# Patient Record
Sex: Female | Born: 1970
Health system: Southern US, Community
[De-identification: ages and names within clinical notes are randomized; demographics above are authoritative.]

## PROBLEM LIST (undated history)

## (undated) DIAGNOSIS — R51 Headache: Secondary | ICD-10-CM

## (undated) DIAGNOSIS — D219 Benign neoplasm of connective and other soft tissue, unspecified: Secondary | ICD-10-CM

## (undated) DIAGNOSIS — N939 Abnormal uterine and vaginal bleeding, unspecified: Secondary | ICD-10-CM

## (undated) DIAGNOSIS — D649 Anemia, unspecified: Secondary | ICD-10-CM

## (undated) DIAGNOSIS — H40053 Ocular hypertension, bilateral: Secondary | ICD-10-CM

## (undated) DIAGNOSIS — K649 Unspecified hemorrhoids: Secondary | ICD-10-CM

## (undated) DIAGNOSIS — K5900 Constipation, unspecified: Secondary | ICD-10-CM

## (undated) DIAGNOSIS — R519 Headache, unspecified: Secondary | ICD-10-CM

## (undated) DIAGNOSIS — K219 Gastro-esophageal reflux disease without esophagitis: Secondary | ICD-10-CM

## (undated) DIAGNOSIS — N926 Irregular menstruation, unspecified: Secondary | ICD-10-CM

## (undated) DIAGNOSIS — K589 Irritable bowel syndrome without diarrhea: Secondary | ICD-10-CM

## (undated) HISTORY — PX: COLONOSCOPY: SHX174

## (undated) HISTORY — PX: VASECTOMY: SHX75

## (undated) HISTORY — DX: Constipation, unspecified: K59.00

## (undated) HISTORY — DX: Ocular hypertension, bilateral: H40.053

## (undated) HISTORY — DX: Benign neoplasm of connective and other soft tissue, unspecified: D21.9

## (undated) HISTORY — DX: Unspecified hemorrhoids: K64.9

## (undated) HISTORY — PX: NM RENAL LASIX (ARMC HX): HXRAD1213

## (undated) HISTORY — PX: INDUCED ABORTION: SHX677

## (undated) HISTORY — PX: WISDOM TOOTH EXTRACTION: SHX21

## (undated) HISTORY — DX: Irritable bowel syndrome, unspecified: K58.9

## (undated) HISTORY — DX: Anemia, unspecified: D64.9

## (undated) HISTORY — PX: UPPER GASTROINTESTINAL ENDOSCOPY: SHX188

---

## 1999-07-08 ENCOUNTER — Other Ambulatory Visit: Admission: RE | Admit: 1999-07-08 | Discharge: 1999-07-08 | Payer: Self-pay | Admitting: Obstetrics and Gynecology

## 2000-08-17 ENCOUNTER — Other Ambulatory Visit: Admission: RE | Admit: 2000-08-17 | Discharge: 2000-08-17 | Payer: Self-pay | Admitting: Obstetrics and Gynecology

## 2001-08-29 ENCOUNTER — Other Ambulatory Visit: Admission: RE | Admit: 2001-08-29 | Discharge: 2001-08-29 | Payer: Self-pay | Admitting: Obstetrics and Gynecology

## 2002-10-29 ENCOUNTER — Other Ambulatory Visit: Admission: RE | Admit: 2002-10-29 | Discharge: 2002-10-29 | Payer: Self-pay | Admitting: Obstetrics and Gynecology

## 2003-08-14 ENCOUNTER — Other Ambulatory Visit: Admission: RE | Admit: 2003-08-14 | Discharge: 2003-08-14 | Payer: Self-pay | Admitting: Obstetrics and Gynecology

## 2004-01-15 ENCOUNTER — Ambulatory Visit (HOSPITAL_COMMUNITY): Admission: RE | Admit: 2004-01-15 | Discharge: 2004-01-15 | Payer: Self-pay | Admitting: *Deleted

## 2004-09-14 ENCOUNTER — Other Ambulatory Visit: Admission: RE | Admit: 2004-09-14 | Discharge: 2004-09-14 | Payer: Self-pay | Admitting: Obstetrics and Gynecology

## 2005-09-23 ENCOUNTER — Other Ambulatory Visit: Admission: RE | Admit: 2005-09-23 | Discharge: 2005-09-23 | Payer: Self-pay | Admitting: Obstetrics and Gynecology

## 2006-12-26 ENCOUNTER — Encounter: Admission: RE | Admit: 2006-12-26 | Discharge: 2006-12-26 | Payer: Self-pay | Admitting: Obstetrics and Gynecology

## 2006-12-29 ENCOUNTER — Encounter: Admission: RE | Admit: 2006-12-29 | Discharge: 2006-12-29 | Payer: Self-pay | Admitting: Obstetrics and Gynecology

## 2007-08-15 ENCOUNTER — Ambulatory Visit: Payer: Self-pay | Admitting: Internal Medicine

## 2007-08-20 ENCOUNTER — Ambulatory Visit (HOSPITAL_COMMUNITY): Admission: RE | Admit: 2007-08-20 | Discharge: 2007-08-20 | Payer: Self-pay | Admitting: Internal Medicine

## 2007-08-30 DIAGNOSIS — G43909 Migraine, unspecified, not intractable, without status migrainosus: Secondary | ICD-10-CM | POA: Insufficient documentation

## 2007-08-30 DIAGNOSIS — K5901 Slow transit constipation: Secondary | ICD-10-CM | POA: Insufficient documentation

## 2007-12-13 ENCOUNTER — Telehealth: Payer: Self-pay | Admitting: Internal Medicine

## 2008-03-04 ENCOUNTER — Telehealth: Payer: Self-pay | Admitting: Internal Medicine

## 2008-06-02 IMAGING — MG MM DIAGNOSTIC LTD LEFT
3 series · 3 of 3 positions shown · non-contrast
Comparison: Baseline screening mammogram dated 12/26/06.

[REDACTED] LEFT
CC and MLO view(s) were taken of the left breast.

DIGITAL LIMITED LEFT DIAGNOSTIC MAMMOGRAM WITH CAD:
CLINICAL DATA: Possible left breast mass at recent screening mammography.

[L MLO (1 of 2)]
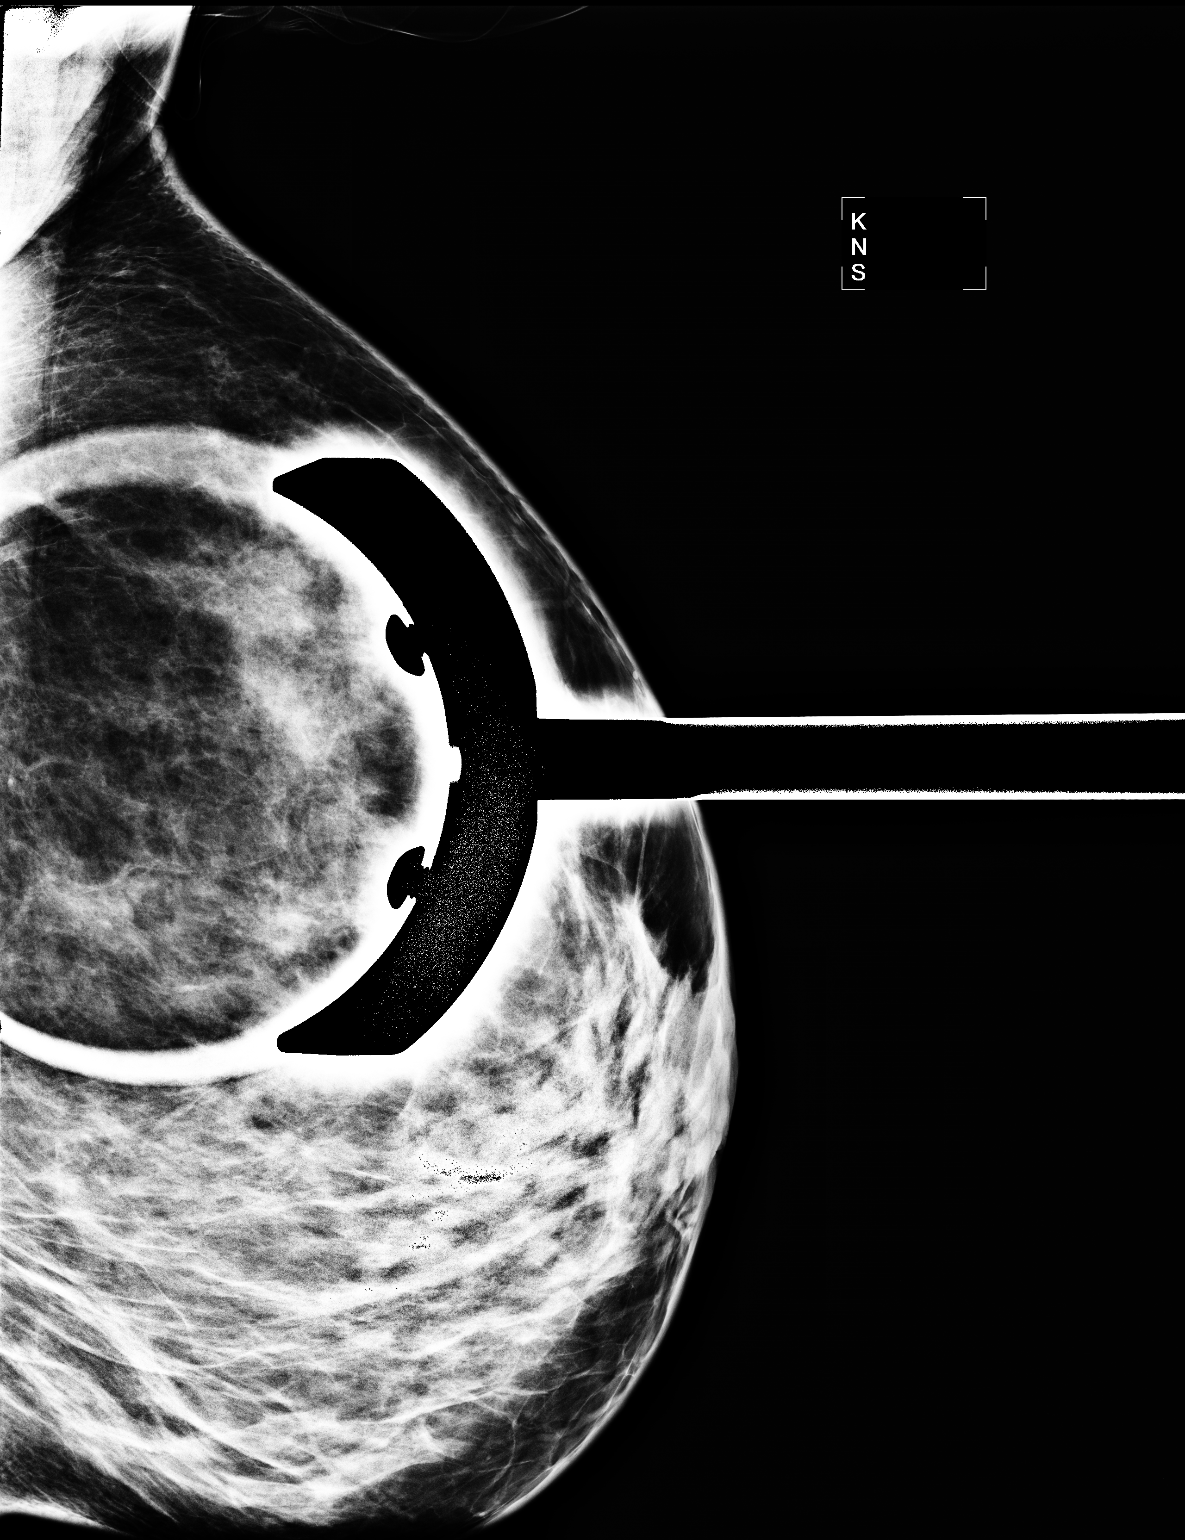

[L MLO (2 of 2)]
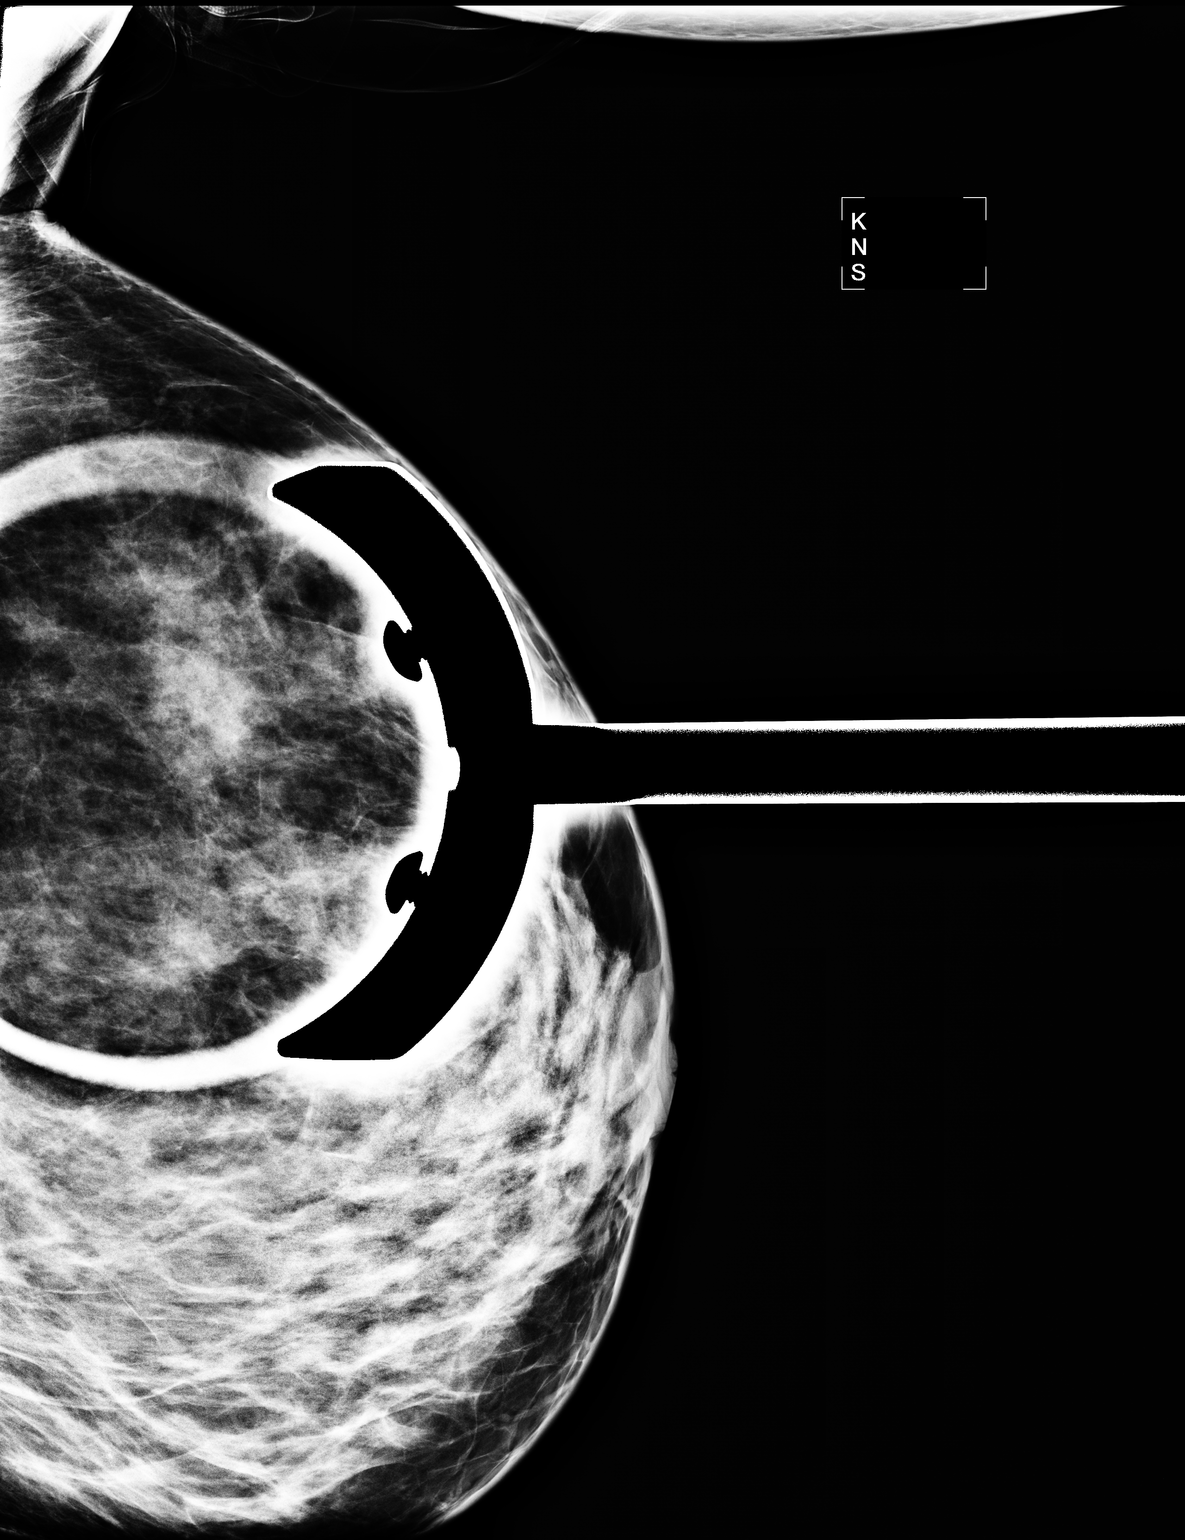

[L ML]
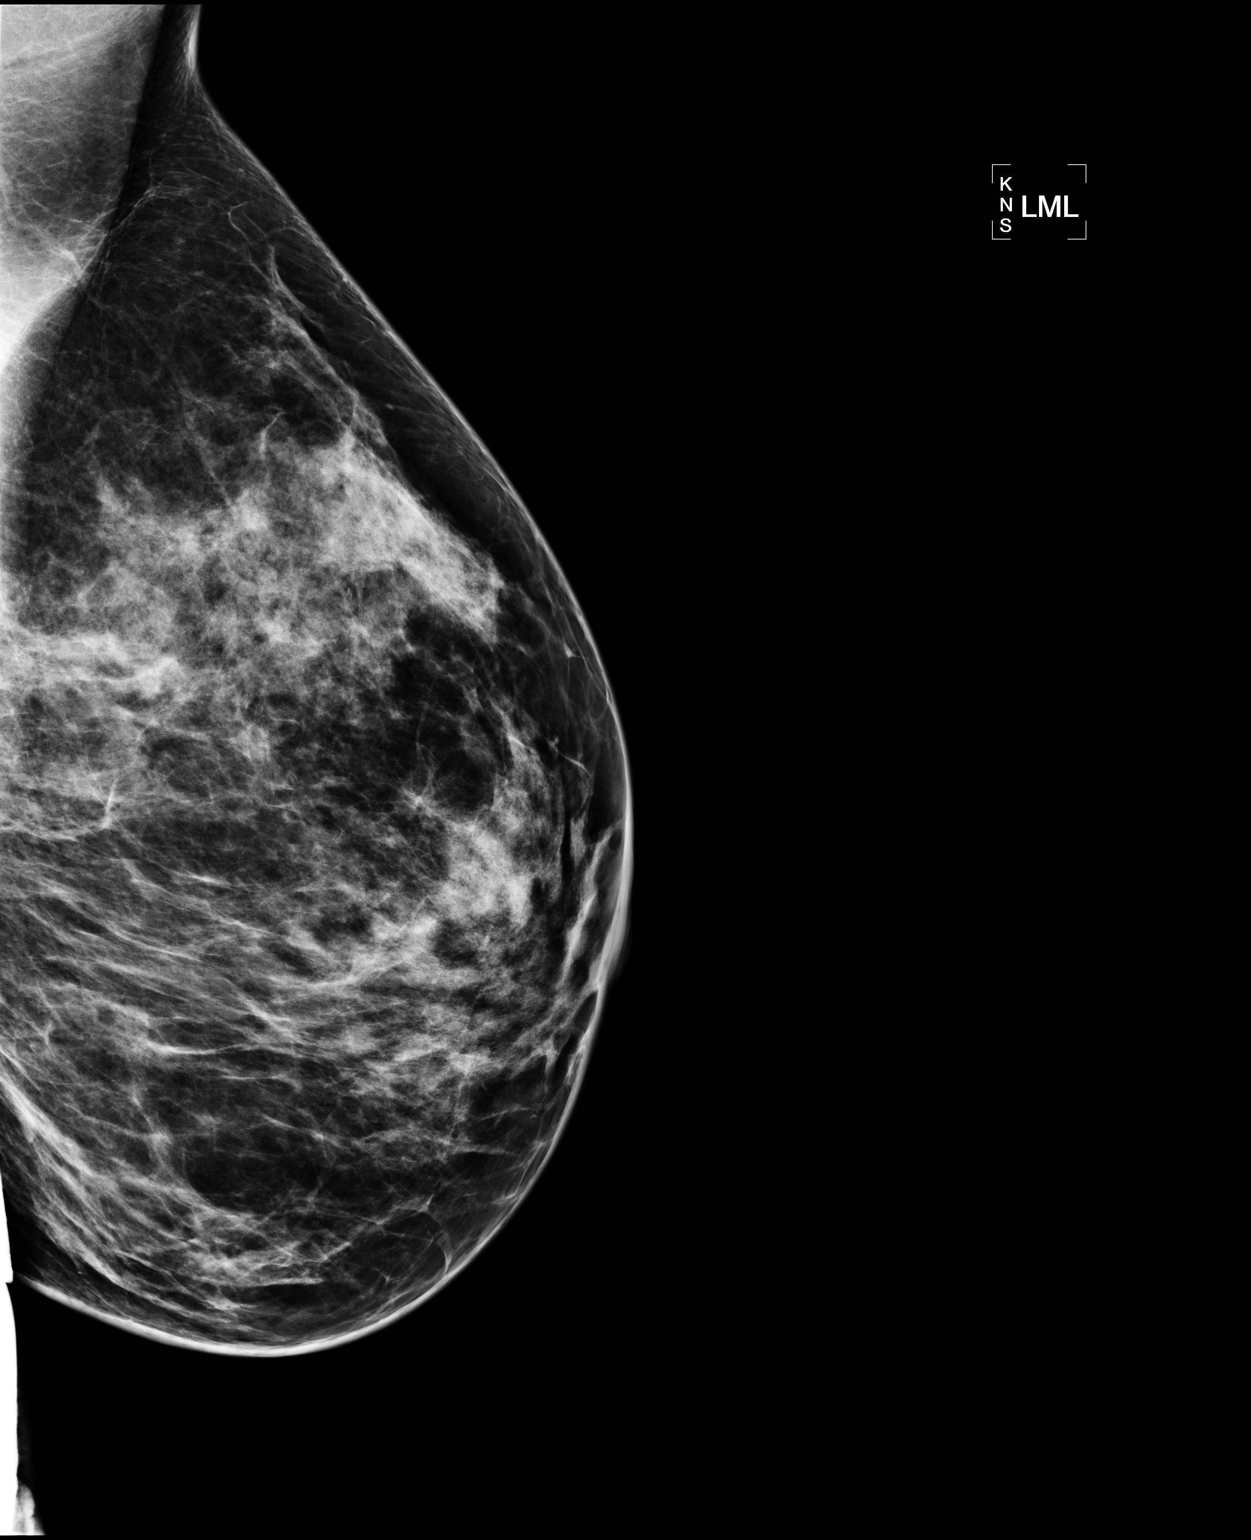

[3 of 3 positions shown; findings below may reference images not displayed]

True lateral and spot compression oblique views of the left breast demonstrate normal-appearing 
breast parenchyma at the locations of the recently suspected masses. No true mass or other findings
suspicious for malignancy are seen.
IMPRESSION: No evidence of malignancy.  The recently suspected left breast masses represented overlapping of 
normal tissue.  Screening mammography is recommended at age 38.

ASSESSMENT: Negative - BI-RADS 1

Screening mammogram of both breasts at age 38.
ANALYZED BY COMPUTER AIDED DETECTION. , THIS PROCEDURE WAS A DIGITAL MAMMOGRAM.

## 2010-06-20 ENCOUNTER — Encounter: Payer: Self-pay | Admitting: Obstetrics and Gynecology

## 2010-10-12 NOTE — Assessment & Plan Note (Signed)
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  She saw  Dr. Loreta [name] in 1995 and had a complete evaluation, which included upper  endoscopy and colonoscopy.  We do not have the records, but the patient  was told the exams were normal.  She subsequently saw Dr. Madilyn [name] for her  constipation and was put on MiraLax, which worked for a while, but she  really did not like taking it.  Her constipation is moderately severe.  Without a laxative, patient has bowel movement every four to five days,  but it is usually associated with abdominal distention, a feeling of  fullness, and just not feeling well.  She has found a senna-containing  tea.  It is called Super Dieters Tea, and she takes one bag several  times a week with excellent results.  She has been doing this now for  about two years with satisfactory results.  She has not missed any work  and has not had any increase in the abdominal pain.  There is a family  history of colon cancer in an indirect relative.  This was a grandfather  and some uncle.  There is nobody with Crohn's disease or ulcerative  colitis.  She is very active at work, working as a Barrister's clerk.  She has good eating habits, emphasizing fiber.  Her weight  has been about ten pounds above her usual weight.  She has switched  shifts from daytime job to 7 p.m. to 7 a.m. 12-hour night jobs in the  last year, but her constipation preceded the change in the shift.   MEDICATIONS:  Birth control pills.   PAST HISTORY:  Essentially negative for any operations.   FAMILY HISTORY:  Negative except for colon cancer in an uncle and a  grandfather.   SOCIAL HISTORY:  Single, respiratory therapist.  She has an associate  degree.  She does not smoke and does not drink alcohol.   REVIEW OF SYSTEMS:  Positive for allergies, sleeping problems due to  night schedule.   PHYSICAL EXAM:  Blood pressure 106/66, pulse 72 and weight 150.4 pounds,  which is about 10 pounds above her usual weight.  She was healthy-appearing, alert, oriented, very cooperative.  Sclerae was anicteric.  Oral cavity was normal.  NECK:  Supple, no adenopathy.  Thyroid not enlarged.  LUNGS:  Clear to auscultation.  COR:  With normal S1, normal S2.  ABDOMEN:  Soft, not protuberant.  Bowel sounds were somewhat hypoactive.  There was no tenderness, no palpable mass, no CVA tenderness.  RECTAL EXAM:  With normal tone.  There was essentially no stool.  Mucus  was Hemoccult-negative.   IMPRESSION:  Patient is a 40 year old African-American female with  chronic constipation, most likely due to decreased motility.  She had a  normal colonoscopic exam within  the last 13 years.  I do not see any  reason to repeat it, but I would appreciate her getting a Sitzmark  transit time study, which will enable to tell us whether she had diffuse  hypomotility of the colon or just focal hypomotility in the rectum.   PLAN:  I think her senna tea is quite adequate.  It is a mild natural  stimulant.  She does not have any excessive pain with it and prefers it  from MiraLax.  She has been quite compliant with it and has had good  results.  Depending on the results of Sitzmark study, we may possibly  add some other medication, such as Amitiza.  I would like to see her  again in about three months to discuss the effect of her medications.     Sydney [name]. Sydney Chance, MD  Electronically Signed    DMB/MedQ  DD: 08/15/2007  DT: 08/15/2007  Job #: [id number]   cc:   Sydney [name]. Sydney [name], M.D.

## 2011-01-13 ENCOUNTER — Other Ambulatory Visit: Payer: Self-pay | Admitting: Obstetrics and Gynecology

## 2011-01-13 DIAGNOSIS — Z1231 Encounter for screening mammogram for malignant neoplasm of breast: Secondary | ICD-10-CM

## 2011-01-20 ENCOUNTER — Ambulatory Visit: Payer: Self-pay

## 2011-05-31 HISTORY — PX: MYOMECTOMY: SHX85

## 2011-09-19 ENCOUNTER — Encounter (HOSPITAL_COMMUNITY): Payer: Self-pay | Admitting: *Deleted

## 2011-09-30 ENCOUNTER — Encounter (HOSPITAL_COMMUNITY): Payer: Self-pay

## 2011-10-10 NOTE — H&P (Addendum)
Sydney [name] presents today for second opinion evaluation of fibroids.  Apparently never had any problems and has never been told she has had problems and has been on birth control pills and began having very heavy periods and cramping with her periods.  Dr. Senaida Ores had recommended changing the birth control pills.  Verena insisted on doing an ultrasound.  She had an ultrasound including saline infusion ultrasound dated 07/26/11.  I have that with me today.  I reviewed this.  It shows multiple fibroids, they stopped at 9.  As far as measuring, the largest is 20 mm.  On saline infusion ultrasound, two of these, the 1.3 cm and the 0.8 cm one were submucosal.  The patient subsequently changed birth control pills, had more bleeding, went back to her regular birth control pill, which is Lo-Ogestrel which is a 30 mcg pill, and actually she has done very well on this does.  Since then, the bleeding has actually improved.  She and her boyfriend have questions about fertility, different options with the fibroids.  She has lots of questions with regard to fibroids. A&P: Fibroids, menorrhagia, dysmenorrhea.  Review of the ultrasound, reviewed with fibroids are, discussed different options.  The first question is whether or not she desires fertility.  They are unsure on this whether they want children, they just do not want to do anything permanent at this time, so they are unclear on that.  Discussed age-related fertility decline.  Discussed advanced maternal age and risks associated with Down's and chromosomal disorders in the 96s but otherwise medically she is stable.  Discussed infertility related to fibroids.  We would not know that unless we did an HSG or some kind of evaluation of the endometrial cavity.  With regard to fibroids, we can manage this hormonally, continue with birth control pills trying to find the right pill, and at this time apparently she is doing very well with the one she is on, so we will stay  with that.  Discussed the possible use of Depo-Lupron if she ever gets in trouble but I think that is unlikely.  Discussed that the fibroids will most likely change or get worse with time.  Discussed myomectomy, which may not give quite the benefit since she has multiple fibroids and they are all very small.  Right now her main problem is the bleeding.  Discussed the submucosal fibroids.  Discussed different options with them such as hysteroscopic resection of these.  At this time, she wants to proceed with hysteroscopic resection of the fibroids.  I discussed the procedure at length.  Discussed that we would only be able to shave the part of the fibroid that is in the endometrial cavity, which may not remove the fibroid.  They certainly can recur or worsen and it may only give temporary benefit.  For now, she wants to proceed with that since it is outpatient.  Discussed the risks and benefits of the procedure at length including but not limited to risk of infection, bleeding, damage to the uterus, tubes, ovaries, bowel, bladder, possibility that they can recur if it does not remove the fibroid.  For now she wants to proceed with that, knowing that other options could include myomectomy, hysterectomy or repeat evaluation of the fibroids.  I did recommend that at the very minimum, repeat ultrasound in six months for evaluation of the fibroids, since this is a new diagnosis. DL  This patient has been seen and examined.   All of her questions were answered.  Labs and vital signs reviewed.  Informed consent has been obtained.  The History and Physical is current.  10/14/11 DL

## 2011-10-13 MED ORDER — DEXTROSE 5 % IV SOLN
1.0000 g | INTRAVENOUS | Status: AC
  Administered 2011-10-14: 1 g via INTRAVENOUS
  Filled 2011-10-13: qty 1

## 2011-10-14 ENCOUNTER — Encounter (HOSPITAL_COMMUNITY): Payer: Self-pay | Admitting: Anesthesiology

## 2011-10-14 ENCOUNTER — Encounter (HOSPITAL_COMMUNITY)
Admission: RE | Disposition: A | Discharge status: Home / Self Care | Payer: Self-pay | Source: Ambulatory Visit | Attending: Obstetrics and Gynecology

## 2011-10-14 ENCOUNTER — Ambulatory Visit (HOSPITAL_COMMUNITY): Payer: 59 | Admitting: Anesthesiology

## 2011-10-14 ENCOUNTER — Ambulatory Visit (HOSPITAL_COMMUNITY)
Admission: RE | Admit: 2011-10-14 | Discharge: 2011-10-14 | Disposition: A | Discharge status: Home / Self Care | Payer: 59 | Source: Ambulatory Visit | Attending: Obstetrics and Gynecology | Admitting: Obstetrics and Gynecology

## 2011-10-14 DIAGNOSIS — N920 Excessive and frequent menstruation with regular cycle: Secondary | ICD-10-CM | POA: Insufficient documentation

## 2011-10-14 DIAGNOSIS — D250 Submucous leiomyoma of uterus: Secondary | ICD-10-CM | POA: Insufficient documentation

## 2011-10-14 DIAGNOSIS — N946 Dysmenorrhea, unspecified: Secondary | ICD-10-CM | POA: Insufficient documentation

## 2011-10-14 HISTORY — PX: HYSTEROSCOPY WITH D & C: SHX1775

## 2011-10-14 HISTORY — DX: Irregular menstruation, unspecified: N92.6

## 2011-10-14 HISTORY — DX: Headache: R51

## 2011-10-14 HISTORY — DX: Abnormal uterine and vaginal bleeding, unspecified: N93.9

## 2011-10-14 LAB — CBC
HCT: 37.5 % (ref 36.0–46.0)
MCH: 26.8 pg (ref 26.0–34.0)
MCV: 83.9 fL (ref 78.0–100.0)
RBC: 4.47 MIL/uL (ref 3.87–5.11)
WBC: 7.7 10*3/uL (ref 4.0–10.5)

## 2011-10-14 LAB — PREGNANCY, URINE: Preg Test, Ur: NEGATIVE

## 2011-10-14 SURGERY — DILATATION AND CURETTAGE /HYSTEROSCOPY
Anesthesia: General | Site: Uterus | Wound class: Clean Contaminated

## 2011-10-14 MED ORDER — MIDAZOLAM HCL 2 MG/2ML IJ SOLN: 0.5000 mg | Freq: Once | INTRAMUSCULAR | Status: DC | PRN

## 2011-10-14 MED ORDER — KETOROLAC TROMETHAMINE 30 MG/ML IJ SOLN
INTRAMUSCULAR | Status: DC | PRN
  Administered 2011-10-14: 30 mg via INTRAVENOUS

## 2011-10-14 MED ORDER — FENTANYL CITRATE 0.05 MG/ML IJ SOLN
INTRAMUSCULAR | Status: DC | PRN
  Administered 2011-10-14 (×2): 50 ug via INTRAVENOUS

## 2011-10-14 MED ORDER — ACETAMINOPHEN 325 MG PO TABS: 325.0000 mg | ORAL_TABLET | ORAL | Status: DC | PRN

## 2011-10-14 MED ORDER — SODIUM CHLORIDE 0.9 % IR SOLN
Status: DC | PRN
  Administered 2011-10-14 (×2): 3000 mL

## 2011-10-14 MED ORDER — KETOROLAC TROMETHAMINE 30 MG/ML IJ SOLN
INTRAMUSCULAR | Status: AC
  Filled 2011-10-14: qty 1

## 2011-10-14 MED ORDER — LIDOCAINE HCL (CARDIAC) 20 MG/ML IV SOLN
INTRAVENOUS | Status: AC
  Filled 2011-10-14: qty 5

## 2011-10-14 MED ORDER — LIDOCAINE HCL (CARDIAC) 20 MG/ML IV SOLN
INTRAVENOUS | Status: DC | PRN
  Administered 2011-10-14: 50 mg via INTRAVENOUS

## 2011-10-14 MED ORDER — MIDAZOLAM HCL 2 MG/2ML IJ SOLN
INTRAMUSCULAR | Status: AC
  Filled 2011-10-14: qty 2

## 2011-10-14 MED ORDER — PROPOFOL 10 MG/ML IV EMUL
INTRAVENOUS | Status: AC
  Filled 2011-10-14: qty 20

## 2011-10-14 MED ORDER — KETOROLAC TROMETHAMINE 30 MG/ML IJ SOLN: 15.0000 mg | Freq: Once | INTRAMUSCULAR | Status: DC | PRN

## 2011-10-14 MED ORDER — ONDANSETRON HCL 4 MG/2ML IJ SOLN
INTRAMUSCULAR | Status: AC
  Filled 2011-10-14: qty 2

## 2011-10-14 MED ORDER — PROPOFOL 10 MG/ML IV EMUL
INTRAVENOUS | Status: DC | PRN
  Administered 2011-10-14: 200 mg via INTRAVENOUS

## 2011-10-14 MED ORDER — FENTANYL CITRATE 0.05 MG/ML IJ SOLN
INTRAMUSCULAR | Status: AC
  Filled 2011-10-14: qty 2

## 2011-10-14 MED ORDER — FENTANYL CITRATE 0.05 MG/ML IJ SOLN
25.0000 ug | INTRAMUSCULAR | Status: DC | PRN
  Administered 2011-10-14: 25 ug via INTRAVENOUS
  Administered 2011-10-14: 50 ug via INTRAVENOUS

## 2011-10-14 MED ORDER — MIDAZOLAM HCL 5 MG/5ML IJ SOLN
INTRAMUSCULAR | Status: DC | PRN
  Administered 2011-10-14: 2 mg via INTRAVENOUS

## 2011-10-14 MED ORDER — LACTATED RINGERS IV SOLN
INTRAVENOUS | Status: DC
  Administered 2011-10-14 (×2): via INTRAVENOUS

## 2011-10-14 MED ORDER — MEPERIDINE HCL 25 MG/ML IJ SOLN: 6.2500 mg | INTRAMUSCULAR | Status: DC | PRN

## 2011-10-14 MED ORDER — PROMETHAZINE HCL 25 MG/ML IJ SOLN: 6.2500 mg | INTRAMUSCULAR | Status: DC | PRN

## 2011-10-14 MED ORDER — ONDANSETRON HCL 4 MG/2ML IJ SOLN
INTRAMUSCULAR | Status: DC | PRN
  Administered 2011-10-14: 4 mg via INTRAVENOUS

## 2011-10-14 MED ORDER — DEXTROSE-NACL 5-0.45 % IV SOLN: INTRAVENOUS | Status: DC

## 2011-10-14 MED ORDER — HYDROCODONE-ACETAMINOPHEN 5-500 MG PO TABS: 1.0000 | ORAL_TABLET | Freq: Four times a day (QID) | ORAL | Status: AC | PRN

## 2011-10-14 SURGICAL SUPPLY — 16 items
CANISTER SUCTION 2500CC (MISCELLANEOUS) ×2 IMPLANT
CATH ROBINSON RED A/P 16FR (CATHETERS) ×2 IMPLANT
CLOTH BEACON ORANGE TIMEOUT ST (SAFETY) ×2 IMPLANT
CONTAINER PREFILL 10% NBF 60ML (FORM) ×2 IMPLANT
ELECT REM PT RETURN 9FT ADLT (ELECTROSURGICAL)
ELECTRODE REM PT RTRN 9FT ADLT (ELECTROSURGICAL) IMPLANT
GLOVE BIO SURGEON STRL SZ8 (GLOVE) ×2 IMPLANT
GLOVE SURG ORTHO 8.0 STRL STRW (GLOVE) ×2 IMPLANT
GOWN PREVENTION PLUS LG XLONG (DISPOSABLE) ×2 IMPLANT
GOWN STRL REIN XL XLG (GOWN DISPOSABLE) ×2 IMPLANT
LOOP ANGLED CUTTING 22FR (CUTTING LOOP) IMPLANT
PACK HYSTEROSCOPY LF (CUSTOM PROCEDURE TRAY) ×2 IMPLANT
SOLUTION ANTI FOG 6CC (MISCELLANEOUS) ×2 IMPLANT
TOWEL OR 17X24 6PK STRL BLUE (TOWEL DISPOSABLE) ×4 IMPLANT
TRUCLEAR BLADE 2.9 ×2 IMPLANT
WATER STERILE IRR 1000ML POUR (IV SOLUTION) ×2 IMPLANT

## 2011-10-14 NOTE — Op Note (Signed)
NAME:  DAMARI, HILTZ          ACCOUNT NO.:  [id number]  MEDICAL RECORD NO.:  [id number]  LOCATION:  [facility]                          FACILITY:  [facility]  PHYSICIAN:  Dineen Kid. Rana Snare, M.D.    DATE OF BIRTH:  10/21/1970  DATE OF PROCEDURE: DATE OF DISCHARGE:                              OPERATIVE REPORT   PREOPERATIVE DIAGNOSES:  Abnormal uterine bleeding, fibroids with submucosal and intracavitary component.  POSTOPERATIVE DIAGNOSES:  Abnormal uterine bleeding, fibroids with submucosal and intracavitary component.  PROCEDURE:  Hysteroscopy, dilation and curettage with a TRUCLEAR resection of intracavitary and submucosal fibroids.  SURGEON:  Dineen Kid. Rana Snare, MD  ANESTHESIA:  General via LMA.  INDICATIONS:  Ms. Mayford Knife is a 41 year old with worsening abnormal bleeding and menorrhagia with some control on birth control pills.  She had a saline infusion ultrasound which shows intracavitary and submucosal fibroids.  She desires resection of these fibroids.  We discussed the risks and benefits of the procedure at length which include but not limited to risks of infection, bleeding, damage to uterus, tubes, ovary, bowel, and bladder, possibility that this may not alleviate and the bleeding could recur or worsen, and this is no cure for the abnormal bleeding as they can recur.  She does give informed consent and wished to proceed.  FINDINGS AT TIME OF SURGERY:  A moderate-sized posterior wall intracavitary fibroid, approximately 2-3 cm in size and a submucosal fibroid near the anterior wall approximately 1.5 cm in size.  DESCRIPTION OF PROCEDURE:  After adequate analgesia, the patient was placed in the dorsal lithotomy position.  She was sterilely prepped and draped.  Bladder was sterilely drained.  Graves speculum was placed. Tenaculum was placed into the anterior lip of the cervix.  The cervix was dilated to a #27 Pratt dilator.  The above findings were noted.  A TRUCLEAR device was  inserted into the posterior wall.  A 2.5-cm fibroid was easily identified and TRUCLEAR resection was used to dissect and resect across the entirety of the fibroid and down to flush to the posterior wall with good hemostasis achieved and the entire fibroid was completely removed.  A right fundal anterior fibroid of approximately 1.5 cm was noted with approximately two thirds of its within the cavity. TRUCLEAR device was used to dissect across this.  As we approached the uterine wall, the remaining portion of the fibroid popped into the cavity and it was felt that the entire fibroid was resected until the wall was flushed.  No further fibroids were noted within the endometrial cavity.  Normal-appearing ostia and cervix.  Good hemostasis was achieved.  No complications noted.  The TRUCLEAR was then removed. Tenaculum was removed from the anterior lip of the cervix.  The patient was sent to the recovery room in stable condition.  Sponge and instrument counts were normal x3.  Saline deficit was 740 mL.  Minimal bleeding was noted.  The patient received 1 g of cefotetan preoperatively and 30 mg of Toradol postoperatively.  DISPOSITION:  The patient will be discharged home.  She will follow up in the office in 2-3 weeks.  She is given the routine instruction sheet for D and C, told to return for increased pain, fever,  or bleeding.  I also sent home with a prescription for Vicodin.     Dineen Kid Rana Snare, M.D.     DCL/MEDQ  D:  10/14/2011  T:  10/14/2011  Job:  [id number]

## 2011-10-14 NOTE — Discharge Instructions (Addendum)
Fibroids Fibroids are lumps (tumors) that can occur any place in a woman's body. These lumps are not cancerous. Fibroids vary in size, weight, and where they grow. HOME CARE  Do not take aspirin.   Write down the number of pads or tampons you use during your period. Tell your doctor. This can help determine the best treatment for you.  GET HELP RIGHT AWAY IF:  You have pain in your lower belly (abdomen) that is not helped with medicine.   You have cramps that are not helped with medicine.   You have more bleeding between or during your period.   You feel lightheaded or pass out (faint).   Your lower belly pain gets worse.  MAKE SURE YOU:  Understand these instructions.   Will watch your condition.   Will get help right away if you are not doing well or get worse.  Document Released: 06/18/2010 Document Revised: 05/05/2011 Document Reviewed: 06/18/2010 Exit[employer] Patient Information 2012 [employer]. 

## 2011-10-14 NOTE — Transfer of Care (Signed)
Immediate Anesthesia Transfer of Care Note  Patient: Sydney [name]  Procedure(s) Performed: Procedure(s) (LRB): DILATATION AND CURETTAGE /HYSTEROSCOPY (N/A)  Patient Location: PACU  Anesthesia Type: General  Level of Consciousness: awake, alert  and oriented  Airway & Oxygen Therapy: Patient Spontanous Breathing and Patient connected to nasal cannula oxygen  Post-op Assessment: Report given to PACU RN and Post -op Vital signs reviewed and stable  Post vital signs: Reviewed and stable  Complications: No apparent anesthesia complications

## 2011-10-14 NOTE — Anesthesia Preprocedure Evaluation (Signed)
Anesthesia Evaluation  Patient identified by MRN, date of birth, ID band Patient awake    Reviewed: Allergy & Precautions, H&P , Patient's Chart, lab work & pertinent test results, reviewed documented beta blocker date and time   History of Anesthesia Complications Negative for: history of anesthetic complications  Airway Mallampati: II TM Distance: >3 FB Neck ROM: full    Dental No notable dental hx.    Pulmonary neg pulmonary ROS,  breath sounds clear to auscultation  Pulmonary exam normal       Cardiovascular Exercise Tolerance: Good negative cardio ROS  Rhythm:regular Rate:Normal     Neuro/Psych negative neurological ROS  negative psych ROS   GI/Hepatic negative GI ROS, Neg liver ROS,   Endo/Other  negative endocrine ROS  Renal/GU negative Renal ROS     Musculoskeletal   Abdominal   Peds  Hematology negative hematology ROS (+)   Anesthesia Other Findings   Reproductive/Obstetrics negative OB ROS                           Anesthesia Physical Anesthesia Plan  ASA: I  Anesthesia Plan: General LMA   Post-op Pain Management:    Induction:   Airway Management Planned:   Additional Equipment:   Intra-op Plan:   Post-operative Plan:   Informed Consent: I have reviewed the patients History and Physical, chart, labs and discussed the procedure including the risks, benefits and alternatives for the proposed anesthesia with the patient or authorized representative who has indicated his/her understanding and acceptance.   Dental Advisory Given  Plan Discussed with: CRNA, Surgeon and Anesthesiologist  Anesthesia Plan Comments:         Anesthesia Quick Evaluation  

## 2011-10-14 NOTE — Op Note (Addendum)
AUB, Fibroids Procedure:  Hyst with True clear resections of intracavitary/submocousal fibroids Sydney [name] Anesthesia:  gen by LMA EBL Min Saline def 740 No Complications DL Dictation #[id number]

## 2011-10-14 NOTE — Anesthesia Postprocedure Evaluation (Signed)
Anesthesia Post Note  Patient: Sydney [name]  Procedure(s) Performed: Procedure(s) (LRB): DILATATION AND CURETTAGE /HYSTEROSCOPY (N/A)  Anesthesia type: GA  Patient location: PACU  Post pain: Pain level controlled  Post assessment: Post-op Vital signs reviewed  Last Vitals:  Filed Vitals:   10/14/11 0840  BP: 124/86  Pulse: 80  Temp: 36.8 C  Resp: 18    Post vital signs: Reviewed  Level of consciousness: sedated  Complications: No apparent anesthesia complications

## 2011-10-15 ENCOUNTER — Encounter (HOSPITAL_COMMUNITY): Payer: Self-pay | Admitting: Obstetrics and Gynecology

## 2013-10-15 ENCOUNTER — Encounter: Payer: Self-pay | Admitting: Family Medicine

## 2013-10-15 ENCOUNTER — Ambulatory Visit (INDEPENDENT_AMBULATORY_CARE_PROVIDER_SITE_OTHER): Payer: BC Managed Care – PPO | Admitting: Family Medicine

## 2013-10-15 VITALS — BP 118/74 | HR 91 | Temp 98.0°F | Ht 62.0 in | Wt 163.5 lb

## 2013-10-15 DIAGNOSIS — E559 Vitamin D deficiency, unspecified: Secondary | ICD-10-CM

## 2013-10-15 DIAGNOSIS — K589 Irritable bowel syndrome without diarrhea: Secondary | ICD-10-CM

## 2013-10-15 DIAGNOSIS — Z136 Encounter for screening for cardiovascular disorders: Secondary | ICD-10-CM

## 2013-10-15 DIAGNOSIS — K146 Glossodynia: Secondary | ICD-10-CM

## 2013-10-15 DIAGNOSIS — Z0000 Encounter for general adult medical examination without abnormal findings: Secondary | ICD-10-CM

## 2013-10-15 LAB — LIPID PANEL
Cholesterol: 172 mg/dL (ref 0–200)
HDL: 83.2 mg/dL (ref >39.00)
LDL Cholesterol: 75 mg/dL (ref 0–99)
Total CHOL/HDL Ratio: 2
Triglycerides: 70 mg/dL (ref 0.0–149.0)
VLDL: 14 mg/dL (ref 0.0–40.0)

## 2013-10-15 LAB — COMPREHENSIVE METABOLIC PANEL
ALBUMIN: 3.8 g/dL (ref 3.5–5.2)
ALT: 26 U/L (ref 0–35)
AST: 28 U/L (ref 0–37)
Alkaline Phosphatase: 49 U/L (ref 39–117)
BILIRUBIN TOTAL: 0.1 mg/dL — AB (ref 0.2–1.2)
BUN: 8 mg/dL (ref 6–23)
CHLORIDE: 104 meq/L (ref 96–112)
CO2: 26 meq/L (ref 19–32)
Calcium: 9.4 mg/dL (ref 8.4–10.5)
Creatinine, Ser: 1.1 mg/dL (ref 0.4–1.2)
GFR: 69.09 mL/min (ref >60.00)
Glucose, Bld: 83 mg/dL (ref 70–99)
POTASSIUM: 4.1 meq/L (ref 3.5–5.1)
SODIUM: 137 meq/L (ref 135–145)
TOTAL PROTEIN: 7.6 g/dL (ref 6.0–8.3)

## 2013-10-15 LAB — CBC WITH DIFFERENTIAL/PLATELET
BASOS PCT: 0.5 % (ref 0.0–3.0)
Basophils Absolute: 0 10*3/uL (ref 0.0–0.1)
EOS PCT: 0.7 % (ref 0.0–5.0)
Eosinophils Absolute: 0 10*3/uL (ref 0.0–0.7)
HCT: 37.1 % (ref 36.0–46.0)
Hemoglobin: 12.3 g/dL (ref 12.0–15.0)
LYMPHS PCT: 44 % (ref 12.0–46.0)
Lymphs Abs: 2.8 10*3/uL (ref 0.7–4.0)
MCHC: 33.2 g/dL (ref 30.0–36.0)
MCV: 83.9 fl (ref 78.0–100.0)
Monocytes Absolute: 0.3 10*3/uL (ref 0.1–1.0)
Monocytes Relative: 4.7 % (ref 3.0–12.0)
NEUTROS PCT: 50.1 % (ref 43.0–77.0)
Neutro Abs: 3.1 10*3/uL (ref 1.4–7.7)
Platelets: 275 10*3/uL (ref 150.0–400.0)
RBC: 4.42 Mil/uL (ref 3.87–5.11)
RDW: 14.4 % (ref 11.5–15.5)
WBC: 6.3 10*3/uL (ref 4.0–10.5)

## 2013-10-15 LAB — VITAMIN B12: VITAMIN B 12: 848 pg/mL (ref 211–911)

## 2013-10-15 MED ORDER — NORETHIN-ETH ESTRAD TRIPHASIC 0.5/0.75/1-35 MG-MCG PO TABS: 1.0000 | ORAL_TABLET | Freq: Every day | ORAL | Status: DC

## 2013-10-15 MED ORDER — LINACLOTIDE 145 MCG PO CAPS: 145.0000 ug | ORAL_CAPSULE | Freq: Every day | ORAL | Status: DC

## 2013-10-15 NOTE — Assessment & Plan Note (Signed)
Will rule out B12 deficiency today. May benefit from IM monthly B12.

## 2013-10-15 NOTE — Assessment & Plan Note (Signed)
With constipation. Will start Linzess 145 mg daily. Follow up in 1 month. The patient indicates understanding of these issues and agrees with the plan.

## 2013-10-15 NOTE — Progress Notes (Signed)
Subjective:   Patient ID: Sydney [name], female    DOB: Oct 09, 1970, 43 y.o.   MRN: [id number]  NIOKA THORINGTON is a pleasant 43 y.o. 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  Exercises daily.  Trying to lose weight. Just got married.  Painful tongue- previous MD gave her magic mouthwash- no improvement.  Does like red wine which seems to make it worse.  Was told it was "geographical tongue."  IBS with constipation- has seen Dr. Olevia Perches.  Colonoscopy neg. Taking OTC laxatives but remains constipated.  Patient Active Problem List   Diagnosis Date Noted  . IBS (irritable bowel syndrome) 10/15/2013  . Unspecified vitamin D deficiency 10/15/2013  . Painful tongue 10/15/2013  . MIGRAINE HEADACHE 08/30/2007  . SLOW TRANSIT CONSTIPATION 08/30/2007   Past Medical History  Diagnosis Date  . Abnormal bleeding in menstrual cycle   . Headache(784.0)     otc med prn  . IBS (irritable bowel syndrome)   . Hemorrhoids    Past Surgical History  Procedure Laterality Date  . Wisdom tooth extraction    . Colonoscopy    . Upper gastrointestinal endoscopy    . Hysteroscopy w/d&c  10/14/2011    Procedure: DILATATION AND CURETTAGE /HYSTEROSCOPY;  Surgeon: Luz Lex, MD;  Location: [facility];  Service: Gynecology;  Laterality: N/A;  WITH TRUCLEAR morcellation  . Myomectomy  2013  . Induced abortion     History  Substance Use Topics  . Smoking status: Never Smoker   . Smokeless tobacco: Never Used  . Alcohol Use: Yes     Comment: wine - socially   Family History  Problem Relation Age of Onset  . Heart disease Maternal Grandmother   . Hypertension Paternal Grandmother   . Hypertension Paternal Grandfather    No Known Allergies Current Outpatient Prescriptions on File Prior to Visit  Medication Sig Dispense Refill  . B Complex Vitamins (VITAMIN B COMPLEX) TABS Take 1 tablet by mouth daily.      Marland Kitchen ibuprofen (ADVIL,MOTRIN)  800 MG tablet Take 800 mg by mouth every 4 (four) hours as needed. For cramps-- pt says she does take every 4-6 hours if needed for major cramping      . norgestrel-ethinyl estradiol (LO/OVRAL,CRYSELLE) 0.3-30 MG-MCG tablet Take 1 tablet by mouth daily.      Marland Kitchen OVER THE COUNTER MEDICATION Take 2 tablets by mouth daily as needed. fennoside herbal laxative for contipation      . pseudoephedrine (SUDAFED) 30 MG tablet Take 60 mg by mouth daily as needed. As needed for sinus migraine.  Take with excedrin      . vitamin C (ASCORBIC ACID) 500 MG tablet Take 500 mg by mouth daily.       No current facility-administered medications on file prior to visit.   The PMH, PSH, Social History, Family History, Medications, and allergies have been reviewed in [facility], and have been updated if relevant.   Review of Systems See HPI    Objective:    BP 118/74  Pulse 91  Temp(Src) 98 F (36.7 C) (Oral)  Ht 5\' 2"  (1.575 m)  Wt 163 lb 8 oz (74.163 kg)  BMI 29.90 kg/m2  SpO2 98%  LMP 08/19/2013   Physical Exam  Gen:  Alert, pleasant, NAD HEENT:  Faint white ring around tip of top of tongue No swelling Resp:  CTA bilaterally CVS:  RRR Abd:  Soft, NT, +  BS Ext:  No edema      Assessment & Plan:   IBS (irritable bowel syndrome)  Unspecified vitamin D deficiency - Plan: Vitamin D, 25-hydroxy  Painful tongue - Plan: Vitamin B12  Routine general medical examination at a health care facility - Plan: Comprehensive metabolic panel, CBC with Differential  Screening for ischemic heart disease - Plan: Lipid panel No Follow-up on file.

## 2013-10-15 NOTE — Assessment & Plan Note (Signed)
Recheck Vit D today. 

## 2013-10-15 NOTE — Progress Notes (Signed)
Pre visit review using our clinic review tool, if applicable. No additional management support is needed unless otherwise documented below in the visit note. 

## 2013-10-15 NOTE — Patient Instructions (Addendum)
Nice to meet you. We are starting Linzess 145 mg daily- call me in a few weeks with an update.   I will call you with your lab results and you can view them online.

## 2013-10-16 LAB — VITAMIN D 25 HYDROXY (VIT D DEFICIENCY, FRACTURES): Vit D, 25-Hydroxy: 90 ng/mL — ABNORMAL HIGH (ref 30–89)

## 2013-10-18 ENCOUNTER — Ambulatory Visit (INDEPENDENT_AMBULATORY_CARE_PROVIDER_SITE_OTHER): Payer: BC Managed Care – PPO

## 2013-10-18 DIAGNOSIS — K146 Glossodynia: Secondary | ICD-10-CM

## 2013-10-18 MED ORDER — CYANOCOBALAMIN 1000 MCG/ML IJ SOLN
1000.0000 ug | Freq: Once | INTRAMUSCULAR | Status: AC
  Administered 2013-10-18: 1000 ug via INTRAMUSCULAR

## 2013-12-02 ENCOUNTER — Encounter: Payer: Self-pay | Admitting: Family Medicine

## 2013-12-12 ENCOUNTER — Ambulatory Visit (INDEPENDENT_AMBULATORY_CARE_PROVIDER_SITE_OTHER): Payer: BC Managed Care – PPO

## 2013-12-12 DIAGNOSIS — E538 Deficiency of other specified B group vitamins: Secondary | ICD-10-CM

## 2013-12-12 MED ORDER — CYANOCOBALAMIN 1000 MCG/ML IJ SOLN
1000.0000 ug | Freq: Once | INTRAMUSCULAR | Status: AC
  Administered 2013-12-12: 1000 ug via INTRAMUSCULAR

## 2014-01-06 ENCOUNTER — Encounter: Payer: Self-pay | Admitting: Internal Medicine

## 2014-01-06 ENCOUNTER — Ambulatory Visit (INDEPENDENT_AMBULATORY_CARE_PROVIDER_SITE_OTHER): Payer: BC Managed Care – PPO | Admitting: Internal Medicine

## 2014-01-06 VITALS — BP 122/74 | HR 80 | Temp 98.6°F | Wt 169.0 lb

## 2014-01-06 DIAGNOSIS — S90129A Contusion of unspecified lesser toe(s) without damage to nail, initial encounter: Secondary | ICD-10-CM

## 2014-01-06 DIAGNOSIS — S90222A Contusion of left lesser toe(s) with damage to nail, initial encounter: Secondary | ICD-10-CM

## 2014-01-06 NOTE — Progress Notes (Signed)
Subjective:    Patient ID: Sydney [name], female    DOB: 1970-09-20, 43 y.o.   MRN: [id number]  HPI  Pt presents to the clinic today with c/o a nail problem. This started 2 weeks ago. It affects her left big toe. She has noticed a discoloration to it. There is no pain associated with it. She denies any injury to the area. She has not tried anything OTC.  Review of Systems      Past Medical History  Diagnosis Date  . Abnormal bleeding in menstrual cycle   . Headache(784.0)     otc med prn  . IBS (irritable bowel syndrome)   . Hemorrhoids     Current Outpatient Prescriptions  Medication Sig Dispense Refill  . B Complex Vitamins (VITAMIN B COMPLEX) TABS Take 1 tablet by mouth daily.      . Cholecalciferol (VITAMIN D3) 3000 UNITS TABS Take 1 capsule by mouth daily.      . cyanocobalamin (,VITAMIN B-12,) 1000 MCG/ML injection Inject 1,000 mcg into the muscle every 30 (thirty) days.      Marland Kitchen ibuprofen (ADVIL,MOTRIN) 800 MG tablet Take 800 mg by mouth every 4 (four) hours as needed. For cramps-- pt says she does take every 4-6 hours if needed for major cramping      . norethindrone-ethinyl estradiol (NORTREL 7/7/7) 0.5/0.75/1-35 MG-MCG tablet Take 1 tablet by mouth daily.      Marland Kitchen OVER THE COUNTER MEDICATION Take 2 tablets by mouth daily as needed. fennoside herbal laxative for contipation      . pseudoephedrine (SUDAFED) 30 MG tablet Take 60 mg by mouth daily as needed. As needed for sinus migraine.  Take with excedrin       No current facility-administered medications for this visit.    No Known Allergies  Family History  Problem Relation Age of Onset  . Heart disease Maternal Grandmother   . Hypertension Paternal Grandmother   . Hypertension Paternal Grandfather     History   Social History  . Marital Status: Married    Spouse Name: N/A    Number of Children: N/A  . Years of Education: N/A   Occupational History  . Not on file.   Social History Main Topics  .  Smoking status: Never Smoker   . Smokeless tobacco: Never Used  . Alcohol Use: Yes     Comment: wine - socially  . Drug Use: No  . Sexual Activity: Yes    Birth Control/ Protection: Pill   Other Topics Concern  . Not on file   Social History Narrative  . No narrative on file     Constitutional: Denies fever, malaise, fatigue, headache or abrupt weight changes. Skin: Pt reports discoloration of left big toenail. Denies redness, rashes, lesions or ulcercations.    No other specific complaints in a complete review of systems (except as listed in HPI above).  Objective:   Physical Exam   BP 122/74  Pulse 80  Temp(Src) 98.6 F (37 C) (Oral)  Wt 169 lb (76.658 kg)  SpO2 98% Wt Readings from Last 3 Encounters:  01/06/14 169 lb (76.658 kg)  10/15/13 163 lb 8 oz (74.163 kg)  09/19/11 165 lb (74.844 kg)    General: Appears her stated age, well developed, well nourished in NAD. Skin: Warm, dry and intact. Linear green discoloration noted of left big toe nailbed. It appears to be growing out. Healthy nail noted behind the discoloration.  BMET    Component Value Date/Time  NA 137 10/15/2013 1143   K 4.1 10/15/2013 1143   CL 104 10/15/2013 1143   CO2 26 10/15/2013 1143   GLUCOSE 83 10/15/2013 1143   BUN 8 10/15/2013 1143   CREATININE 1.1 10/15/2013 1143   CALCIUM 9.4 10/15/2013 1143    Lipid Panel     Component Value Date/Time   CHOL 172 10/15/2013 1143   TRIG 70.0 10/15/2013 1143   HDL 83.20 10/15/2013 1143   CHOLHDL 2 10/15/2013 1143   VLDL 14.0 10/15/2013 1143   LDLCALC 75 10/15/2013 1143    CBC    Component Value Date/Time   WBC 6.3 10/15/2013 1143   RBC 4.42 10/15/2013 1143   HGB 12.3 10/15/2013 1143   HCT 37.1 10/15/2013 1143   PLT 275.0 10/15/2013 1143   MCV 83.9 10/15/2013 1143   MCH 26.8 10/14/2011 0612   MCHC 33.2 10/15/2013 1143   RDW 14.4 10/15/2013 1143   LYMPHSABS 2.8 10/15/2013 1143   MONOABS 0.3 10/15/2013 1143   EOSABS 0.0 10/15/2013 1143   BASOSABS 0.0  10/15/2013 1143    Hgb A1C No results found for this basename: HGBA1C         Assessment & Plan:   Discoloration of left big toenail:  Appears benign- does not appear fungal Healthy toenail noted as the base of the nail It should grow out with time  RTC as needed

## 2014-01-06 NOTE — Patient Instructions (Addendum)
Contusion °A contusion is the result of an injury to the skin and underlying tissues and is usually caused by direct trauma. The injury results in the appearance of a bruise on the skin overlying the injured tissues. Contusions cause rupture and bleeding of the small capillaries and blood vessels and affect function, because the bleeding infiltrates muscles, tendons, nerves, or other soft tissues.  °SYMPTOMS  °· Swelling and often a hard lump in the injured area, either superficial or deep. °· Pain and tenderness over the area of the contusion. °· Feeling of firmness when pressure is exerted over the contusion. °· Discoloration under the skin, beginning with redness and progressing to the characteristic "black and blue" bruise. °CAUSES  °A contusion is typically the result of direct trauma. This is often by a blunt object.  °RISK INCREASES WITH: °· Sports that have a high likelihood of trauma (football, boxing, ice hockey, soccer, field hockey, martial arts, basketball, and baseball). °· Sports that make falling from a height likely (high-jumping, pole-vaulting, skating, or gymnastics). °· Any bleeding disorder (hemophilia) or taking medications that affect clotting (aspirin, nonsteroidal anti-inflammatory medications, or warfarin [Coumadin]). °· Inadequate protection of exposed areas during contact sports. °PREVENTION °· Maintain physical fitness: °¨ Joint and muscle flexibility. °¨ Strength and endurance. °¨ Coordination. °· Wear proper protective equipment. Make sure it fits correctly. °PROGNOSIS  °Contusions typically heal without any complications. Healing time varies with the severity of injury and intake of medications that affect clotting. Contusions usually heal in 1 to 4 weeks. °RELATED COMPLICATIONS  °· Damage to nearby nerves or blood vessels, causing numbness, coldness, or paleness. °· Compartment syndrome. °· Bleeding into the soft tissues that leads to disability. °· Infiltrative-type bleeding,  leading to the calcification and impaired function of the injured muscle (rare). °· Prolonged healing time if usual activities are resumed too soon. °· Infection if the skin over the injury site is broken. °· Fracture of the bone underlying the contusion. °· Stiffness in the joint where the injured muscle crosses. °TREATMENT  °Treatment initially consists of resting the injured area as well as medication and ice to reduce inflammation. The use of a compression bandage may also be helpful in minimizing inflammation. As pain diminishes and movement is tolerated, the joint where the affected muscle crosses should be moved to prevent stiffness and the shortening (contracture) of the joint. Movement of the joint should begin as soon as possible. It is also important to work on maintaining strength within the affected muscles. °Occasionally, extra padding over the area of contusion may be recommended before returning to sports, particularly if re-injury is likely.  °MEDICATION  °· If pain relief is necessary these medications are often recommended: °¨ Nonsteroidal anti-inflammatory medications, such as aspirin and ibuprofen. °¨ Other minor pain relievers, such as acetaminophen, are often recommended. °· Prescription pain relievers may be given by your caregiver. Use only as directed and only as much as you need. °HEAT AND COLD °· Cold treatment (icing) relieves pain and reduces inflammation. Cold treatment should be applied for 10 to 15 minutes every 2 to 3 hours for inflammation and pain and immediately after any activity that aggravates your symptoms. Use ice packs or an ice massage. (To do an ice massage fill a large styrofoam cup with water and freeze. Tear a small amount of foam from the top so ice protrudes. Massage ice firmly over the injured area in a circle about the size of a softball.) °· Heat treatment may be used prior to   performing the stretching and strengthening activities prescribed by your caregiver,  physical therapist, or athletic trainer. Use a heat pack or a warm soak. °SEEK MEDICAL CARE IF:  °· Symptoms get worse or do not improve despite treatment in a few days. °· You have difficulty moving a joint. °· Any extremity becomes extremely painful, numb, pale, or cool (This is an emergency!). °· Medication produces any side effects (bleeding, upset stomach, or allergic reaction). °· Signs of infection (drainage from skin, headache, muscle aches, dizziness, fever, or general ill feeling) occur if skin was broken. °Document Released: 05/16/2005 Document Revised: 08/08/2011 Document Reviewed: 08/28/2008 °[employer]® Patient Information ©2015 [employer]. This information is not intended to replace advice given to you by your health care provider. Make sure you discuss any questions you have with your health care provider. ° °

## 2014-01-06 NOTE — Progress Notes (Signed)
Pre visit review using our clinic review tool, if applicable. No additional management support is needed unless otherwise documented below in the visit note. 

## 2014-01-16 ENCOUNTER — Ambulatory Visit (INDEPENDENT_AMBULATORY_CARE_PROVIDER_SITE_OTHER): Payer: BC Managed Care – PPO

## 2014-01-16 DIAGNOSIS — E538 Deficiency of other specified B group vitamins: Secondary | ICD-10-CM

## 2014-01-16 MED ORDER — CYANOCOBALAMIN 1000 MCG/ML IJ SOLN
1000.0000 ug | Freq: Once | INTRAMUSCULAR | Status: AC
  Administered 2014-01-16: 1000 ug via INTRAMUSCULAR

## 2014-02-20 ENCOUNTER — Ambulatory Visit (INDEPENDENT_AMBULATORY_CARE_PROVIDER_SITE_OTHER): Payer: BC Managed Care – PPO

## 2014-02-20 ENCOUNTER — Telehealth: Payer: Self-pay | Admitting: Family Medicine

## 2014-02-20 DIAGNOSIS — E538 Deficiency of other specified B group vitamins: Secondary | ICD-10-CM

## 2014-02-20 MED ORDER — CYANOCOBALAMIN 1000 MCG/ML IJ SOLN
1000.0000 ug | Freq: Once | INTRAMUSCULAR | Status: AC
  Administered 2014-02-20: 1000 ug via INTRAMUSCULAR

## 2014-02-20 NOTE — Telephone Encounter (Signed)
Pt wanted to see if you received her records from dr sheltons office

## 2014-02-21 NOTE — Telephone Encounter (Signed)
Lm on pts vm advising that records have been received and scanned into chart. Pt informed to contact office should she have any additional questions.

## 2014-02-21 NOTE — Telephone Encounter (Signed)
Yes I did and scanned them into the chart.

## 2014-03-06 ENCOUNTER — Other Ambulatory Visit (HOSPITAL_COMMUNITY): Payer: Self-pay | Admitting: Obstetrics and Gynecology

## 2014-03-06 DIAGNOSIS — Z3141 Encounter for fertility testing: Secondary | ICD-10-CM

## 2014-03-17 ENCOUNTER — Ambulatory Visit (HOSPITAL_COMMUNITY)
Admission: RE | Admit: 2014-03-17 | Discharge: 2014-03-17 | Disposition: A | Discharge status: Home / Self Care | Payer: BC Managed Care – PPO | Source: Ambulatory Visit | Attending: Obstetrics and Gynecology | Admitting: Obstetrics and Gynecology

## 2014-03-17 DIAGNOSIS — N979 Female infertility, unspecified: Secondary | ICD-10-CM | POA: Diagnosis present

## 2014-03-17 DIAGNOSIS — Z3141 Encounter for fertility testing: Secondary | ICD-10-CM

## 2014-03-17 MED ORDER — IOHEXOL 300 MG/ML  SOLN
20.0000 mL | Freq: Once | INTRAMUSCULAR | Status: AC | PRN
  Administered 2014-03-17: 30 mL

## 2014-03-26 ENCOUNTER — Ambulatory Visit: Payer: BC Managed Care – PPO

## 2014-04-12 ENCOUNTER — Encounter: Payer: Self-pay | Admitting: Family Medicine

## 2014-04-14 NOTE — Telephone Encounter (Signed)
Spoke to pt who states that she completed physical in May 2015, with establish care visit

## 2014-04-20 ENCOUNTER — Encounter: Payer: Self-pay | Admitting: Family Medicine

## 2014-04-21 ENCOUNTER — Encounter: Payer: Self-pay | Admitting: *Deleted

## 2014-10-23 ENCOUNTER — Encounter: Payer: Self-pay | Admitting: Family Medicine

## 2015-01-21 ENCOUNTER — Encounter: Payer: Self-pay | Admitting: Family Medicine

## 2015-01-21 ENCOUNTER — Ambulatory Visit (INDEPENDENT_AMBULATORY_CARE_PROVIDER_SITE_OTHER): Payer: BLUE CROSS/BLUE SHIELD | Admitting: Family Medicine

## 2015-01-21 VITALS — BP 116/70 | HR 83 | Temp 97.9°F | Ht 62.25 in | Wt 163.2 lb

## 2015-01-21 DIAGNOSIS — E559 Vitamin D deficiency, unspecified: Secondary | ICD-10-CM

## 2015-01-21 DIAGNOSIS — Z0000 Encounter for general adult medical examination without abnormal findings: Secondary | ICD-10-CM | POA: Diagnosis not present

## 2015-01-21 DIAGNOSIS — K5901 Slow transit constipation: Secondary | ICD-10-CM

## 2015-01-21 DIAGNOSIS — K589 Irritable bowel syndrome without diarrhea: Secondary | ICD-10-CM

## 2015-01-21 DIAGNOSIS — G43809 Other migraine, not intractable, without status migrainosus: Secondary | ICD-10-CM

## 2015-01-21 DIAGNOSIS — Z01419 Encounter for gynecological examination (general) (routine) without abnormal findings: Secondary | ICD-10-CM | POA: Insufficient documentation

## 2015-01-21 LAB — COMPREHENSIVE METABOLIC PANEL
ALT: 9 U/L (ref 0–35)
AST: 16 U/L (ref 0–37)
Albumin: 4 g/dL (ref 3.5–5.2)
Alkaline Phosphatase: 73 U/L (ref 39–117)
BUN: 15 mg/dL (ref 6–23)
CHLORIDE: 104 meq/L (ref 96–112)
CO2: 28 meq/L (ref 19–32)
Calcium: 9.3 mg/dL (ref 8.4–10.5)
Creatinine, Ser: 0.95 mg/dL (ref 0.40–1.20)
GFR: 81.37 mL/min (ref >60.00)
Glucose, Bld: 86 mg/dL (ref 70–99)
Potassium: 4.1 mEq/L (ref 3.5–5.1)
SODIUM: 138 meq/L (ref 135–145)
Total Bilirubin: 0.2 mg/dL (ref 0.2–1.2)
Total Protein: 7.4 g/dL (ref 6.0–8.3)

## 2015-01-21 LAB — LIPID PANEL
CHOL/HDL RATIO: 2
Cholesterol: 169 mg/dL (ref 0–200)
HDL: 77.8 mg/dL (ref >39.00)
LDL Cholesterol: 84 mg/dL (ref 0–99)
NonHDL: 91.69
TRIGLYCERIDES: 36 mg/dL (ref 0.0–149.0)
VLDL: 7.2 mg/dL (ref 0.0–40.0)

## 2015-01-21 LAB — CBC WITH DIFFERENTIAL/PLATELET
Basophils Absolute: 0 10*3/uL (ref 0.0–0.1)
Basophils Relative: 0.7 % (ref 0.0–3.0)
EOS ABS: 0 10*3/uL (ref 0.0–0.7)
Eosinophils Relative: 0.9 % (ref 0.0–5.0)
HCT: 32.5 % — ABNORMAL LOW (ref 36.0–46.0)
Hemoglobin: 10.6 g/dL — ABNORMAL LOW (ref 12.0–15.0)
Lymphocytes Relative: 48.2 % — ABNORMAL HIGH (ref 12.0–46.0)
Lymphs Abs: 2.4 10*3/uL (ref 0.7–4.0)
MCHC: 32.5 g/dL (ref 30.0–36.0)
MCV: 79.1 fl (ref 78.0–100.0)
MONO ABS: 0.3 10*3/uL (ref 0.1–1.0)
Monocytes Relative: 5 % (ref 3.0–12.0)
NEUTROS ABS: 2.3 10*3/uL (ref 1.4–7.7)
NEUTROS PCT: 45.2 % (ref 43.0–77.0)
PLATELETS: 312 10*3/uL (ref 150.0–400.0)
RBC: 4.11 Mil/uL (ref 3.87–5.11)
RDW: 15.5 % (ref 11.5–15.5)
WBC: 5.1 10*3/uL (ref 4.0–10.5)

## 2015-01-21 LAB — VITAMIN D 25 HYDROXY (VIT D DEFICIENCY, FRACTURES): VITD: 40.84 ng/mL (ref 30.00–100.00)

## 2015-01-21 LAB — TSH: TSH: 0.98 u[IU]/mL (ref 0.35–4.50)

## 2015-01-21 LAB — VITAMIN B12: VITAMIN B 12: 758 pg/mL (ref 211–911)

## 2015-01-21 NOTE — Assessment & Plan Note (Signed)
Symptoms stable

## 2015-01-21 NOTE — Assessment & Plan Note (Signed)
Reviewed preventive care protocols, scheduled due services, and updated immunizations Discussed nutrition, exercise, diet, and healthy lifestyle.  Orders Placed This Encounter  Procedures  . CBC with Differential/Platelet  . Comprehensive metabolic panel  . Lipid panel  . TSH  . Vitamin B12  . Vitamin D, 25-hydroxy   Declines flu vaccine.

## 2015-01-21 NOTE — Progress Notes (Signed)
Subjective:   Patient ID: Sydney [name], female    DOB: Jul 30, 1970, 44 y.o.   MRN: [id number]  Sydney [name] is a pleasant 44 y.o. year old female who presents to clinic today with Annual Exam  on 01/21/2015  HPI:  I have not seen her since she established care with me in 09/2013.  Has a GYN- Dr. Corinna Capra- last pap smear and mammogram in in 02/2014.  IBS with constipation- has seen Dr. Olevia Perches. Colonoscopy neg. Taking OTC laxatives but remains constipated.  Felt linzess was not helpful which we tried last year.   Lab Results  Component Value Date   WBC 6.3 10/15/2013   HGB 12.3 10/15/2013   HCT 37.1 10/15/2013   MCV 83.9 10/15/2013   PLT 275.0 10/15/2013   Lab Results  Component Value Date   CHOL 172 10/15/2013   HDL 83.20 10/15/2013   LDLCALC 75 10/15/2013   TRIG 70.0 10/15/2013   CHOLHDL 2 10/15/2013   Lab Results  Component Value Date   CREATININE 1.1 10/15/2013   Lab Results  Component Value Date   NA 137 10/15/2013   K 4.1 10/15/2013   CL 104 10/15/2013   CO2 26 10/15/2013   Lab Results  Component Value Date   ALT 26 10/15/2013   AST 28 10/15/2013   ALKPHOS 49 10/15/2013   BILITOT 0.1* 10/15/2013   No results found for: TSH  Current Outpatient Prescriptions on File Prior to Visit  Medication Sig Dispense Refill  . B Complex Vitamins (VITAMIN B COMPLEX) TABS Take 1 tablet by mouth daily.    . Cholecalciferol (VITAMIN D3) 3000 UNITS TABS Take 1 capsule by mouth daily.    Marland Kitchen ibuprofen (ADVIL,MOTRIN) 800 MG tablet Take 800 mg by mouth every 4 (four) hours as needed. For cramps-- pt says she does take every 4-6 hours if needed for major cramping    . OVER THE COUNTER MEDICATION Take 2 tablets by mouth daily as needed. fennoside herbal laxative for contipation    . pseudoephedrine (SUDAFED) 30 MG tablet Take 60 mg by mouth daily as needed. As needed for sinus migraine.  Take with excedrin     No current facility-administered medications on file prior  to visit.    No Known Allergies  Past Medical History  Diagnosis Date  . Abnormal bleeding in menstrual cycle   . Headache(784.0)     otc med prn  . IBS (irritable bowel syndrome)   . Hemorrhoids     Past Surgical History  Procedure Laterality Date  . Wisdom tooth extraction    . Colonoscopy    . Upper gastrointestinal endoscopy    . Hysteroscopy w/d&c  10/14/2011    Procedure: DILATATION AND CURETTAGE /HYSTEROSCOPY;  Surgeon: Luz Lex, MD;  Location: [facility];  Service: Gynecology;  Laterality: N/A;  WITH TRUCLEAR morcellation  . Myomectomy  2013  . Induced abortion      Family History  Problem Relation Age of Onset  . Heart disease Maternal Grandmother   . Hypertension Paternal Grandmother   . Hypertension Paternal Grandfather     Social History   Social History  . Marital Status: Married    Spouse Name: N/A  . Number of Children: N/A  . Years of Education: N/A   Occupational History  . Not on file.   Social History Main Topics  . Smoking status: Never Smoker   . Smokeless tobacco: Never Used  . Alcohol Use: Yes     Comment:  wine - socially  . Drug Use: No  . Sexual Activity: Yes    Birth Control/ Protection: Pill   Other Topics Concern  . Not on file   Social History Narrative   The PMH, PSH, Social History, Family History, Medications, and allergies have been reviewed in [facility], and have been updated if relevant.  Review of Systems  Constitutional: Negative.   HENT: Negative.   Eyes: Negative.   Respiratory: Negative.   Cardiovascular: Negative.   Gastrointestinal: Negative.   Endocrine: Negative.   Genitourinary: Negative.   Musculoskeletal: Negative.   Skin: Negative.   Allergic/Immunologic: Negative.   Neurological: Negative.   Hematological: Negative.   Psychiatric/Behavioral: Negative.   All other systems reviewed and are negative.      Objective:    BP 116/70 mmHg  Pulse 83  Temp(Src) 97.9 F (36.6 C) (Oral)  Ht 5' 2.25"  (1.581 m)  Wt 163 lb 4 oz (74.05 kg)  BMI 29.63 kg/m2  SpO2 98%  LMP 01/19/2015   Physical Exam  Constitutional: She is oriented to person, place, and time. She appears well-developed and well-nourished. No distress.  HENT:  Head: Normocephalic and atraumatic.  Eyes: Conjunctivae are normal.  Neck: Normal range of motion.  Cardiovascular: Normal rate, regular rhythm and normal heart sounds.   Pulmonary/Chest: Effort normal and breath sounds normal. No respiratory distress. She has no wheezes.  Abdominal: Soft.  Musculoskeletal: Normal range of motion. She exhibits no edema.  Neurological: She is alert and oriented to person, place, and time. No cranial nerve deficit.  Skin: Skin is warm and dry.  Psychiatric: She has a normal mood and affect. Her behavior is normal. Judgment and thought content normal.  Nursing note and vitals reviewed.         Assessment & Plan:   Well woman exam - Plan: CBC with Differential/Platelet, Comprehensive metabolic panel, Lipid panel, TSH, Vitamin B12, Vitamin D, 25-hydroxy  IBS (irritable bowel syndrome)  Vitamin D deficiency  Slow transit constipation  Other type of migraine No Follow-up on file.

## 2015-01-21 NOTE — Progress Notes (Signed)
Pre visit review using our clinic review tool, if applicable. No additional management support is needed unless otherwise documented below in the visit note. 

## 2015-01-21 NOTE — Patient Instructions (Signed)
Great to see you. We will call you with your lab results and you can view them online.  

## 2015-01-22 ENCOUNTER — Encounter: Payer: Self-pay | Admitting: *Deleted

## 2015-02-03 ENCOUNTER — Encounter: Payer: Self-pay | Admitting: Family Medicine

## 2015-08-13 ENCOUNTER — Other Ambulatory Visit: Payer: Self-pay | Admitting: Obstetrics and Gynecology

## 2015-08-13 HISTORY — PX: UTERINE FIBROID SURGERY: SHX826

## 2016-01-25 ENCOUNTER — Encounter: Payer: Self-pay | Admitting: Family Medicine

## 2016-01-25 ENCOUNTER — Other Ambulatory Visit: Payer: Self-pay | Admitting: Family Medicine

## 2016-01-25 ENCOUNTER — Other Ambulatory Visit (INDEPENDENT_AMBULATORY_CARE_PROVIDER_SITE_OTHER): Payer: BLUE CROSS/BLUE SHIELD

## 2016-01-25 ENCOUNTER — Ambulatory Visit (INDEPENDENT_AMBULATORY_CARE_PROVIDER_SITE_OTHER): Payer: BLUE CROSS/BLUE SHIELD | Admitting: Family Medicine

## 2016-01-25 VITALS — BP 122/82 | HR 85 | Temp 97.8°F | Ht 62.25 in | Wt 171.2 lb

## 2016-01-25 DIAGNOSIS — E559 Vitamin D deficiency, unspecified: Secondary | ICD-10-CM | POA: Diagnosis not present

## 2016-01-25 DIAGNOSIS — Z01419 Encounter for gynecological examination (general) (routine) without abnormal findings: Secondary | ICD-10-CM

## 2016-01-25 DIAGNOSIS — E538 Deficiency of other specified B group vitamins: Secondary | ICD-10-CM

## 2016-01-25 DIAGNOSIS — K589 Irritable bowel syndrome without diarrhea: Secondary | ICD-10-CM | POA: Diagnosis not present

## 2016-01-25 DIAGNOSIS — Z0000 Encounter for general adult medical examination without abnormal findings: Secondary | ICD-10-CM

## 2016-01-25 DIAGNOSIS — G320 Subacute combined degeneration of spinal cord in diseases classified elsewhere: Secondary | ICD-10-CM

## 2016-01-25 DIAGNOSIS — G43809 Other migraine, not intractable, without status migrainosus: Secondary | ICD-10-CM

## 2016-01-25 LAB — CBC WITH DIFFERENTIAL/PLATELET
BASOS ABS: 0 10*3/uL (ref 0.0–0.1)
Basophils Relative: 0.5 % (ref 0.0–3.0)
EOS PCT: 0.4 % (ref 0.0–5.0)
Eosinophils Absolute: 0 10*3/uL (ref 0.0–0.7)
HCT: 36.5 % (ref 36.0–46.0)
HEMOGLOBIN: 11.9 g/dL — AB (ref 12.0–15.0)
Lymphocytes Relative: 32.8 % (ref 12.0–46.0)
Lymphs Abs: 2.3 10*3/uL (ref 0.7–4.0)
MCHC: 32.6 g/dL (ref 30.0–36.0)
MCV: 84.3 fl (ref 78.0–100.0)
MONO ABS: 0.3 10*3/uL (ref 0.1–1.0)
Monocytes Relative: 4.3 % (ref 3.0–12.0)
Neutro Abs: 4.3 10*3/uL (ref 1.4–7.7)
Neutrophils Relative %: 62 % (ref 43.0–77.0)
Platelets: 273 10*3/uL (ref 150.0–400.0)
RBC: 4.33 Mil/uL (ref 3.87–5.11)
RDW: 13.8 % (ref 11.5–15.5)
WBC: 6.9 10*3/uL (ref 4.0–10.5)

## 2016-01-25 LAB — COMPREHENSIVE METABOLIC PANEL
ALT: 10 U/L (ref 0–35)
AST: 17 U/L (ref 0–37)
Albumin: 3.9 g/dL (ref 3.5–5.2)
Alkaline Phosphatase: 71 U/L (ref 39–117)
BILIRUBIN TOTAL: 0.2 mg/dL (ref 0.2–1.2)
BUN: 13 mg/dL (ref 6–23)
CALCIUM: 9 mg/dL (ref 8.4–10.5)
CHLORIDE: 106 meq/L (ref 96–112)
CO2: 29 mEq/L (ref 19–32)
CREATININE: 0.91 mg/dL (ref 0.40–1.20)
GFR: 85.13 mL/min (ref >60.00)
Glucose, Bld: 78 mg/dL (ref 70–99)
Potassium: 3.9 mEq/L (ref 3.5–5.1)
Sodium: 138 mEq/L (ref 135–145)
Total Protein: 7 g/dL (ref 6.0–8.3)

## 2016-01-25 LAB — LIPID PANEL
Cholesterol: 166 mg/dL (ref 0–200)
HDL: 68 mg/dL (ref >39.00)
LDL Cholesterol: 77 mg/dL (ref 0–99)
NonHDL: 97.71
TRIGLYCERIDES: 106 mg/dL (ref 0.0–149.0)
Total CHOL/HDL Ratio: 2
VLDL: 21.2 mg/dL (ref 0.0–40.0)

## 2016-01-25 LAB — TSH: TSH: 0.95 u[IU]/mL (ref 0.35–4.50)

## 2016-01-25 LAB — VITAMIN D 25 HYDROXY (VIT D DEFICIENCY, FRACTURES): VITD: 30.12 ng/mL (ref 30.00–100.00)

## 2016-01-25 LAB — VITAMIN B12: Vitamin B-12: 461 pg/mL (ref 211–911)

## 2016-01-25 LAB — FERRITIN: FERRITIN: 17.9 ng/mL (ref 10.0–291.0)

## 2016-01-25 MED ORDER — SCOPOLAMINE 1 MG/3DAYS TD PT72: 1.0000 | MEDICATED_PATCH | TRANSDERMAL | 12 refills | Status: DC

## 2016-01-25 NOTE — Progress Notes (Signed)
Pre visit review using our clinic review tool, if applicable. No additional management support is needed unless otherwise documented below in the visit note. 

## 2016-01-25 NOTE — Assessment & Plan Note (Signed)
Reviewed preventive care protocols, scheduled due services, and updated immunizations Discussed nutrition, exercise, diet, and healthy lifestyle.  Orders Placed This Encounter  Procedures  . CBC with Differential/Platelet  . Comprehensive metabolic panel  . Lipid panel  . TSH  . Vitamin D, 25-hydroxy  . Ferritin

## 2016-01-25 NOTE — Progress Notes (Signed)
Subjective:   Patient ID: Sydney [name], female    DOB: Dec 06, 1970, 45 y.o.   MRN: [id number]  Sydney [name] is a pleasant 45 y.o. year old female who presents to clinic today with Annual Exam  on 01/25/2016  HPI:   Has a GYN- Dr. Corinna Capra- last pap smear and mammogram in in 02/2015.  IBS with constipation- has seen Dr. Olevia Perches. Colonoscopy neg. Taking OTC laxatives but remains constipated.  Felt linzess was not helpful which we tried last year.   Lab Results  Component Value Date   WBC 5.1 01/21/2015   HGB 10.6 (L) 01/21/2015   HCT 32.5 (L) 01/21/2015   MCV 79.1 01/21/2015   PLT 312.0 01/21/2015   Lab Results  Component Value Date   CHOL 169 01/21/2015   HDL 77.80 01/21/2015   LDLCALC 84 01/21/2015   TRIG 36.0 01/21/2015   CHOLHDL 2 01/21/2015   Lab Results  Component Value Date   CREATININE 0.95 01/21/2015   Lab Results  Component Value Date   NA 138 01/21/2015   K 4.1 01/21/2015   CL 104 01/21/2015   CO2 28 01/21/2015   Lab Results  Component Value Date   ALT 9 01/21/2015   AST 16 01/21/2015   ALKPHOS 73 01/21/2015   BILITOT 0.2 01/21/2015   Lab Results  Component Value Date   TSH 0.98 01/21/2015    Current Outpatient Prescriptions on File Prior to Visit  Medication Sig Dispense Refill  . B Complex Vitamins (VITAMIN B COMPLEX) TABS Take 1 tablet by mouth daily.    . Cholecalciferol (VITAMIN D3) 3000 UNITS TABS Take 1 capsule by mouth daily.    Marland Kitchen ibuprofen (ADVIL,MOTRIN) 800 MG tablet Take 800 mg by mouth every 4 (four) hours as needed. For cramps-- pt says she does take every 4-6 hours if needed for major cramping    . OVER THE COUNTER MEDICATION Take 2 tablets by mouth daily as needed. fennoside herbal laxative for contipation    . pseudoephedrine (SUDAFED) 30 MG tablet Take 60 mg by mouth daily as needed. As needed for sinus migraine.  Take with excedrin     No current facility-administered medications on file prior to visit.       No Known Allergies  Past Medical History:  Diagnosis Date  . Abnormal bleeding in menstrual cycle   . Fibroids   . Headache(784.0)    otc med prn  . Hemorrhoids   . IBS (irritable bowel syndrome)     Past Surgical History:  Procedure Laterality Date  . COLONOSCOPY    . HYSTEROSCOPY W/D&C  10/14/2011   Procedure: DILATATION AND CURETTAGE /HYSTEROSCOPY;  Surgeon: Luz Lex, MD;  Location: [facility];  Service: Gynecology;  Laterality: N/A;  WITH TRUCLEAR morcellation  . INDUCED ABORTION    . MYOMECTOMY  2013  . UPPER GASTROINTESTINAL ENDOSCOPY    . WISDOM TOOTH EXTRACTION      Family History  Problem Relation Age of Onset  . Heart disease Maternal Grandmother   . Hypertension Paternal Grandmother   . Hypertension Paternal Grandfather     Social History   Social History  . Marital status: Married    Spouse name: N/A  . Number of children: N/A  . Years of education: N/A   Occupational History  . Not on file.   Social History Main Topics  . Smoking status: Never Smoker  . Smokeless tobacco: Never Used  . Alcohol use Yes     Comment:  wine - socially  . Drug use: No  . Sexual activity: Yes    Birth control/ protection: Pill   Other Topics Concern  . Not on file   Social History Narrative  . No narrative on file   The PMH, PSH, Social History, Family History, Medications, and allergies have been reviewed in [facility], and have been updated if relevant.  Review of Systems  Constitutional: Negative.   HENT: Negative.   Eyes: Negative.   Respiratory: Negative.   Cardiovascular: Negative.   Gastrointestinal: Negative.   Endocrine: Negative.   Genitourinary: Negative.   Musculoskeletal: Negative.   Skin: Negative.   Allergic/Immunologic: Negative.   Neurological: Negative.   Hematological: Negative.   Psychiatric/Behavioral: Negative.   All other systems reviewed and are negative.      Objective:    BP 122/82   Pulse 85   Temp 97.8 F (36.6 C) (Oral)    Ht 5' 2.25" (1.581 m)   Wt 171 lb 4 oz (77.7 kg)   LMP 01/21/2016   SpO2 98%   BMI 31.07 kg/m    Physical Exam  Constitutional: She is oriented to person, place, and time. She appears well-developed and well-nourished. No distress.  HENT:  Head: Normocephalic and atraumatic.  Eyes: Conjunctivae are normal.  Neck: Normal range of motion.  Cardiovascular: Normal rate, regular rhythm and normal heart sounds.   Pulmonary/Chest: Effort normal and breath sounds normal. No respiratory distress. She has no wheezes.  Abdominal: Soft.  Musculoskeletal: Normal range of motion. She exhibits no edema.  Neurological: She is alert and oriented to person, place, and time. No cranial nerve deficit.  Skin: Skin is warm and dry.  Psychiatric: She has a normal mood and affect. Her behavior is normal. Judgment and thought content normal.  Nursing note and vitals reviewed.         Assessment & Plan:   Well woman exam - Plan: CBC with Differential/Platelet, Comprehensive metabolic panel, Lipid panel, TSH  Other type of migraine  IBS (irritable bowel syndrome)  Vitamin D deficiency - Plan: Vitamin D, 25-hydroxy No Follow-up on file.

## 2016-01-25 NOTE — Patient Instructions (Signed)
Great to see you.   We will call you with your lab results and you view them online.

## 2016-01-29 ENCOUNTER — Encounter: Payer: Self-pay | Admitting: Family Medicine

## 2016-02-08 ENCOUNTER — Encounter: Payer: Self-pay | Admitting: Family Medicine

## 2016-02-09 ENCOUNTER — Encounter: Payer: Self-pay | Admitting: *Deleted

## 2016-02-25 ENCOUNTER — Ambulatory Visit: Payer: BLUE CROSS/BLUE SHIELD | Admitting: Family Medicine

## 2016-02-25 ENCOUNTER — Encounter: Payer: Self-pay | Admitting: Family Medicine

## 2016-02-25 ENCOUNTER — Ambulatory Visit (INDEPENDENT_AMBULATORY_CARE_PROVIDER_SITE_OTHER): Payer: BLUE CROSS/BLUE SHIELD | Admitting: Family Medicine

## 2016-02-25 VITALS — BP 100/70 | HR 91 | Wt 168.0 lb

## 2016-02-25 DIAGNOSIS — M25511 Pain in right shoulder: Secondary | ICD-10-CM | POA: Diagnosis not present

## 2016-02-25 DIAGNOSIS — M6740 Ganglion, unspecified site: Secondary | ICD-10-CM

## 2016-02-25 MED ORDER — MELOXICAM 15 MG PO TABS: 15.0000 mg | ORAL_TABLET | Freq: Every day | ORAL | 0 refills | Status: DC

## 2016-02-25 NOTE — Patient Instructions (Signed)
Take meloxicam daily for 7-10 days then daily as needed   Impingement Syndrome, Rotator Cuff, Bursitis With Rehab Impingement syndrome is a condition that involves inflammation of the tendons of the rotator cuff and the subacromial bursa, that causes pain in the shoulder. The rotator cuff consists of four tendons and muscles that control much of the shoulder and upper arm function. The subacromial bursa is a fluid filled sac that helps reduce friction between the rotator cuff and one of the bones of the shoulder (acromion). Impingement syndrome is usually an overuse injury that causes swelling of the bursa (bursitis), swelling of the tendon (tendonitis), and/or a tear of the tendon (strain). Strains are classified into three categories. Grade 1 strains cause pain, but the tendon is not lengthened. Grade 2 strains include a lengthened ligament, due to the ligament being stretched or partially ruptured. With grade 2 strains there is still function, although the function may be decreased. Grade 3 strains include a complete tear of the tendon or muscle, and function is usually impaired. SYMPTOMS   Pain around the shoulder, often at the outer portion of the upper arm.  Pain that gets worse with shoulder function, especially when reaching overhead or lifting.  Sometimes, aching when not using the arm.  Pain that wakes you up at night.  Sometimes, tenderness, swelling, warmth, or redness over the affected area.  Loss of strength.  Limited motion of the shoulder, especially reaching behind the back (to the back pocket or to unhook bra) or across your body.  Crackling sound (crepitation) when moving the arm.  Biceps tendon pain and inflammation (in the front of the shoulder). Worse when bending the elbow or lifting. CAUSES  Impingement syndrome is often an overuse injury, in which chronic (repetitive) motions cause the tendons or bursa to become inflamed. A strain occurs when a force is paced on the  tendon or muscle that is greater than it can withstand. Common mechanisms of injury include: Stress from sudden increase in duration, frequency, or intensity of training.  Direct hit (trauma) to the shoulder.  Aging, erosion of the tendon with normal use.  Bony bump on shoulder (acromial spur). RISK INCREASES WITH:  Contact sports (football, wrestling, boxing).  Throwing sports (baseball, tennis, volleyball).  Weightlifting and bodybuilding.  Heavy labor.  Previous injury to the rotator cuff, including impingement.  Poor shoulder strength and flexibility.  Failure to warm up properly before activity.  Inadequate protective equipment.  Old age.  Bony bump on shoulder (acromial spur). PREVENTION   Warm up and stretch properly before activity.  Allow for adequate recovery between workouts.  Maintain physical fitness:  Strength, flexibility, and endurance.  Cardiovascular fitness.  Learn and use proper exercise technique. PROGNOSIS  If treated properly, impingement syndrome usually goes away within 6 weeks. Sometimes surgery is required.  RELATED COMPLICATIONS   Longer healing time if not properly treated, or if not given enough time to heal.  Recurring symptoms, that result in a chronic condition.  Shoulder stiffness, frozen shoulder, or loss of motion.  Rotator cuff tendon tear.  Recurring symptoms, especially if activity is resumed too soon, with overuse, with a direct blow, or when using poor technique. TREATMENT  Treatment first involves the use of ice and medicine, to reduce pain and inflammation. The use of strengthening and stretching exercises may help reduce pain with activity. These exercises may be performed at home or with a therapist. If non-surgical treatment is unsuccessful after more than 6 months, surgery may be  advised. After surgery and rehabilitation, activity is usually possible in 3 months.  MEDICATION  If pain medicine is needed,  nonsteroidal anti-inflammatory medicines (aspirin and ibuprofen), or other minor pain relievers (acetaminophen), are often advised.  Do not take pain medicine for 7 days before surgery.  Prescription pain relievers may be given, if your caregiver thinks they are needed. Use only as directed and only as much as you need.  Corticosteroid injections may be given by your caregiver. These injections should be reserved for the most serious cases, because they may only be given a certain number of times. HEAT AND COLD  Cold treatment (icing) should be applied for 10 to 15 minutes every 2 to 3 hours for inflammation and pain, and immediately after activity that aggravates your symptoms. Use ice packs or an ice massage.  Heat treatment may be used before performing stretching and strengthening activities prescribed by your caregiver, physical therapist, or athletic trainer. Use a heat pack or a warm water soak. SEEK MEDICAL CARE IF:   Symptoms get worse or do not improve in 4 to 6 weeks, despite treatment.  New, unexplained symptoms develop. (Drugs used in treatment may produce side effects.) EXERCISES  RANGE OF MOTION (ROM) AND STRETCHING EXERCISES - Impingement Syndrome (Rotator Cuff  Tendinitis, Bursitis) These exercises may help you when beginning to rehabilitate your injury. Your symptoms may go away with or without further involvement from your physician, physical therapist or athletic trainer. While completing these exercises, remember:   Restoring tissue flexibility helps normal motion to return to the joints. This allows healthier, less painful movement and activity.  An effective stretch should be held for at least 30 seconds.  A stretch should never be painful. You should only feel a gentle lengthening or release in the stretched tissue. STRETCH - Flexion, Standing  Stand with good posture. With an underhand grip on your right / left hand, and an overhand grip on the opposite hand,  grasp a broomstick or cane so that your hands are a little more than shoulder width apart.  Keeping your right / left elbow straight and shoulder muscles relaxed, push the stick with your opposite hand, to raise your right / left arm in front of your body and then overhead. Raise your arm until you feel a stretch in your right / left shoulder, but before you have increased shoulder pain.  Try to avoid shrugging your right / left shoulder as your arm rises, by keeping your shoulder blade tucked down and toward your mid-back spine. Hold for __________ seconds.  Slowly return to the starting position. Repeat __________ times. Complete this exercise __________ times per day. STRETCH - Abduction, Supine  Lie on your back. With an underhand grip on your right / left hand and an overhand grip on the opposite hand, grasp a broomstick or cane so that your hands are a little more than shoulder width apart.  Keeping your right / left elbow straight and your shoulder muscles relaxed, push the stick with your opposite hand, to raise your right / left arm out to the side of your body and then overhead. Raise your arm until you feel a stretch in your right / left shoulder, but before you have increased shoulder pain.  Try to avoid shrugging your right / left shoulder as your arm rises, by keeping your shoulder blade tucked down and toward your mid-back spine. Hold for __________ seconds.  Slowly return to the starting position. Repeat __________ times. Complete this exercise   __________ times per day. ROM - Flexion, Active-Assisted  Lie on your back. You may bend your knees for comfort.  Grasp a broomstick or cane so your hands are about shoulder width apart. Your right / left hand should grip the end of the stick, so that your hand is positioned "thumbs-up," as if you were about to shake hands.  Using your healthy arm to lead, raise your right / left arm overhead, until you feel a gentle stretch in your  shoulder. Hold for __________ seconds.  Use the stick to assist in returning your right / left arm to its starting position. Repeat __________ times. Complete this exercise __________ times per day.  ROM - Internal Rotation, Supine   Lie on your back on a firm surface. Place your right / left elbow about 60 degrees away from your side. Elevate your elbow with a folded towel, so that the elbow and shoulder are the same height.  Using a broomstick or cane and your strong arm, pull your right / left hand toward your body until you feel a gentle stretch, but no increase in your shoulder pain. Keep your shoulder and elbow in place throughout the exercise.  Hold for __________ seconds. Slowly return to the starting position. Repeat __________ times. Complete this exercise __________ times per day. STRETCH - Internal Rotation  Place your right / left hand behind your back, palm up.  Throw a towel or belt over your opposite shoulder. Grasp the towel with your right / left hand.  While keeping an upright posture, gently pull up on the towel, until you feel a stretch in the front of your right / left shoulder.  Avoid shrugging your right / left shoulder as your arm rises, by keeping your shoulder blade tucked down and toward your mid-back spine.  Hold for __________ seconds. Release the stretch, by lowering your healthy hand. Repeat __________ times. Complete this exercise __________ times per day. ROM - Internal Rotation   Using an underhand grip, grasp a stick behind your back with both hands.  While standing upright with good posture, slide the stick up your back until you feel a mild stretch in the front of your shoulder.  Hold for __________ seconds. Slowly return to your starting position. Repeat __________ times. Complete this exercise __________ times per day.  STRETCH - Posterior Shoulder Capsule   Stand or sit with good posture. Grasp your right / left elbow and draw it across your  chest, keeping it at the same height as your shoulder.  Pull your elbow, so your upper arm comes in closer to your chest. Pull until you feel a gentle stretch in the back of your shoulder.  Hold for __________ seconds. Repeat __________ times. Complete this exercise __________ times per day. STRENGTHENING EXERCISES - Impingement Syndrome (Rotator Cuff Tendinitis, Bursitis) These exercises may help you when beginning to rehabilitate your injury. They may resolve your symptoms with or without further involvement from your physician, physical therapist or athletic trainer. While completing these exercises, remember:  Muscles can gain both the endurance and the strength needed for everyday activities through controlled exercises.  Complete these exercises as instructed by your physician, physical therapist or athletic trainer. Increase the resistance and repetitions only as guided.  You may experience muscle soreness or fatigue, but the pain or discomfort you are trying to eliminate should never worsen during these exercises. If this pain does get worse, stop and make sure you are following the directions exactly. If the  pain is still present after adjustments, discontinue the exercise until you can discuss the trouble with your clinician.  During your recovery, avoid activity or exercises which involve actions that place your injured hand or elbow above your head or behind your back or head. These positions stress the tissues which you are trying to heal. STRENGTH - Scapular Depression and Adduction   With good posture, sit on a firm chair. Support your arms in front of you, with pillows, arm rests, or on a table top. Have your elbows in line with the sides of your body.  Gently draw your shoulder blades down and toward your mid-back spine. Gradually increase the tension, without tensing the muscles along the top of your shoulders and the back of your neck.  Hold for __________ seconds. Slowly  release the tension and relax your muscles completely before starting the next repetition.  After you have practiced this exercise, remove the arm support and complete the exercise in standing as well as sitting position. Repeat __________ times. Complete this exercise __________ times per day.  STRENGTH - Shoulder Abductors, Isometric  With good posture, stand or sit about 4-6 inches from a wall, with your right / left side facing the wall.  Bend your right / left elbow. Gently press your right / left elbow into the wall. Increase the pressure gradually, until you are pressing as hard as you can, without shrugging your shoulder or increasing any shoulder discomfort.  Hold for __________ seconds.  Release the tension slowly. Relax your shoulder muscles completely before you begin the next repetition. Repeat __________ times. Complete this exercise __________ times per day.  STRENGTH - External Rotators, Isometric  Keep your right / left elbow at your side and bend it 90 degrees.  Step into a door frame so that the outside of your right / left wrist can press against the door frame without your upper arm leaving your side.  Gently press your right / left wrist into the door frame, as if you were trying to swing the back of your hand away from your stomach. Gradually increase the tension, until you are pressing as hard as you can, without shrugging your shoulder or increasing any shoulder discomfort.  Hold for __________ seconds.  Release the tension slowly. Relax your shoulder muscles completely before you begin the next repetition. Repeat __________ times. Complete this exercise __________ times per day.  STRENGTH - Supraspinatus   Stand or sit with good posture. Grasp a __________ weight, or an exercise band or tubing, so that your hand is "thumbs-up," like you are shaking hands.  Slowly lift your right / left arm in a "V" away from your thigh, diagonally into the space between your  side and straight ahead. Lift your hand to shoulder height or as far as you can, without increasing any shoulder pain. At first, many people do not lift their hands above shoulder height.  Avoid shrugging your right / left shoulder as your arm rises, by keeping your shoulder blade tucked down and toward your mid-back spine.  Hold for __________ seconds. Control the descent of your hand, as you slowly return to your starting position. Repeat __________ times. Complete this exercise __________ times per day.  STRENGTH - External Rotators  Secure a rubber exercise band or tubing to a fixed object (table, pole) so that it is at the same height as your right / left elbow when you are standing or sitting on a firm surface.  Stand or sit   so that the secured exercise band is at your uninjured side.  Bend your right / left elbow 90 degrees. Place a folded towel or small pillow under your right / left arm, so that your elbow is a few inches away from your side.  Keeping the tension on the exercise band, pull it away from your body, as if pivoting on your elbow. Be sure to keep your body steady, so that the movement is coming only from your rotating shoulder.  Hold for __________ seconds. Release the tension in a controlled manner, as you return to the starting position. Repeat __________ times. Complete this exercise __________ times per day.  STRENGTH - Internal Rotators   Secure a rubber exercise band or tubing to a fixed object (table, pole) so that it is at the same height as your right / left elbow when you are standing or sitting on a firm surface.  Stand or sit so that the secured exercise band is at your right / left side.  Bend your elbow 90 degrees. Place a folded towel or small pillow under your right / left arm so that your elbow is a few inches away from your side.  Keeping the tension on the exercise band, pull it across your body, toward your stomach. Be sure to keep your body steady,  so that the movement is coming only from your rotating shoulder.  Hold for __________ seconds. Release the tension in a controlled manner, as you return to the starting position. Repeat __________ times. Complete this exercise __________ times per day.  STRENGTH - Scapular Protractors, Standing   Stand arms length away from a wall. Place your hands on the wall, keeping your elbows straight.  Begin by dropping your shoulder blades down and toward your mid-back spine.  To strengthen your protractors, keep your shoulder blades down, but slide them forward on your rib cage. It will feel as if you are lifting the back of your rib cage away from the wall. This is a subtle motion and can be challenging to complete. Ask your caregiver for further instruction, if you are not sure you are doing the exercise correctly.  Hold for __________ seconds. Slowly return to the starting position, resting the muscles completely before starting the next repetition. Repeat __________ times. Complete this exercise __________ times per day. STRENGTH - Scapular Protractors, Supine  Lie on your back on a firm surface. Extend your right / left arm straight into the air while holding a __________ weight in your hand.  Keeping your head and back in place, lift your shoulder off the floor.  Hold for __________ seconds. Slowly return to the starting position, and allow your muscles to relax completely before starting the next repetition. Repeat __________ times. Complete this exercise __________ times per day. STRENGTH - Scapular Protractors, Quadruped  Get onto your hands and knees, with your shoulders directly over your hands (or as close as you can be, comfortably).  Keeping your elbows locked, lift the back of your rib cage up into your shoulder blades, so your mid-back rounds out. Keep your neck muscles relaxed.  Hold this position for __________ seconds. Slowly return to the starting position and allow your  muscles to relax completely before starting the next repetition. Repeat __________ times. Complete this exercise __________ times per day.  STRENGTH - Scapular Retractors  Secure a rubber exercise band or tubing to a fixed object (table, pole), so that it is at the height of your shoulders when you   are either standing, or sitting on a firm armless chair.  With a palm down grip, grasp an end of the band in each hand. Straighten your elbows and lift your hands straight in front of you, at shoulder height. Step back, away from the secured end of the band, until it becomes tense.  Squeezing your shoulder blades together, draw your elbows back toward your sides, as you bend them. Keep your upper arms lifted away from your body throughout the exercise.  Hold for __________ seconds. Slowly ease the tension on the band, as you reverse the directions and return to the starting position. Repeat __________ times. Complete this exercise __________ times per day. STRENGTH - Shoulder Extensors   Secure a rubber exercise band or tubing to a fixed object (table, pole) so that it is at the height of your shoulders when you are either standing, or sitting on a firm armless chair.  With a thumbs-up grip, grasp an end of the band in each hand. Straighten your elbows and lift your hands straight in front of you, at shoulder height. Step back, away from the secured end of the band, until it becomes tense.  Squeezing your shoulder blades together, pull your hands down to the sides of your thighs. Do not allow your hands to go behind you.  Hold for __________ seconds. Slowly ease the tension on the band, as you reverse the directions and return to the starting position. Repeat __________ times. Complete this exercise __________ times per day.  STRENGTH - Scapular Retractors and External Rotators   Secure a rubber exercise band or tubing to a fixed object (table, pole) so that it is at the height as your shoulders,  when you are either standing, or sitting on a firm armless chair.  With a palm down grip, grasp an end of the band in each hand. Bend your elbows 90 degrees and lift your elbows to shoulder height, at your sides. Step back, away from the secured end of the band, until it becomes tense.  Squeezing your shoulder blades together, rotate your shoulders so that your upper arms and elbows remain stationary, but your fists travel upward to head height.  Hold for __________ seconds. Slowly ease the tension on the band, as you reverse the directions and return to the starting position. Repeat __________ times. Complete this exercise __________ times per day.  STRENGTH - Scapular Retractors and External Rotators, Rowing   Secure a rubber exercise band or tubing to a fixed object (table, pole) so that it is at the height of your shoulders, when you are either standing, or sitting on a firm armless chair.  With a palm down grip, grasp an end of the band in each hand. Straighten your elbows and lift your hands straight in front of you, at shoulder height. Step back, away from the secured end of the band, until it becomes tense.  Step 1: Squeeze your shoulder blades together. Bending your elbows, draw your hands to your chest, as if you are rowing a boat. At the end of this motion, your hands and elbow should be at shoulder height and your elbows should be out to your sides.  Step 2: Rotate your shoulders, to raise your hands above your head. Your forearms should be vertical and your upper arms should be horizontal.  Hold for __________ seconds. Slowly ease the tension on the band, as you reverse the directions and return to the starting position. Repeat __________ times. Complete this exercise __________   times per day.  STRENGTH - Scapular Depressors  Find a sturdy chair without wheels, such as a dining room chair.  Keeping your feet on the floor, and your hands on the chair arms, lift your bottom up from  the seat, and lock your elbows.  Keeping your elbows straight, allow gravity to pull your body weight down. Your shoulders will rise toward your ears.  Raise your body against gravity by drawing your shoulder blades down your back, shortening the distance between your shoulders and ears. Although your feet should always maintain contact with the floor, your feet should progressively support less body weight, as you get stronger.  Hold for __________ seconds. In a controlled and slow manner, lower your body weight to begin the next repetition. Repeat __________ times. Complete this exercise __________ times per day.    This information is not intended to replace advice given to you by your health care provider. Make sure you discuss any questions you have with your health care provider.   Document Released: 05/16/2005 Document Revised: 06/06/2014 Document Reviewed: 08/28/2008 [employer] Interactive Patient Education [employer].

## 2016-02-25 NOTE — Progress Notes (Signed)
Subjective:    Patient ID: Sydney [name], female    DOB: [birth year], 45 y.o.   MRN: [id number]  HPI This is a 45 yo female who presents today with right shoulder pain x several months. Can only lift certain ways before discomfort occurs. Putting counter pressure onto joint helps. Pain comes and goes. Has brief moments when it does not hurt. Has not taken any medication for pain. Pain with raising arm. This is her non dominant hand. No heavy lifting, pushing or pulling, no known injury past or present. No numbness/tingling/weakness.  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   Also has a "knot," that comes and goes on her right hand. Sometimes painful. Wears a brace with good relief of symptoms.   Is going on a cruise in a little over a week.   Past Medical History:  Diagnosis Date  . Abnormal bleeding in menstrual cycle   . Fibroids   . Headache(784.0)    otc med prn  . Hemorrhoids   . IBS (irritable bowel syndrome)    Past Surgical History:  Procedure Laterality Date  . COLONOSCOPY    . HYSTEROSCOPY W/D&C  10/14/2011   Procedure: DILATATION AND CURETTAGE /HYSTEROSCOPY;  Surgeon: Luz Lex, MD;  Location: [facility];  Service: Gynecology;  Laterality: N/A;  WITH TRUCLEAR morcellation  . INDUCED ABORTION    . MYOMECTOMY  2013  . UPPER GASTROINTESTINAL ENDOSCOPY    . UTERINE FIBROID SURGERY  08/13/2015   Dr. Corinna Capra  . WISDOM TOOTH EXTRACTION     Family History  Problem Relation Age of Onset  . Heart disease Maternal Grandmother   . Hypertension Paternal Grandmother   . Hypertension Paternal Grandfather    Social History  Substance Use Topics  . Smoking status: Never Smoker  . Smokeless tobacco: Never Used  . Alcohol use Yes     Comment: wine - socially      Review of Systems Per HPI    Objective:   Physical Exam  Constitutional: She is oriented to person, place, and time. She appears well-developed and well-nourished. No distress.  HENT:  Head: Normocephalic and  atraumatic.  Eyes: Conjunctivae are normal.  Neck: Normal range of motion. Neck supple.  Cardiovascular: Normal rate.   Pulmonary/Chest: Effort normal.  Musculoskeletal:       Right shoulder: She exhibits decreased range of motion (pain and decreased rom with abduction) and tenderness (over anterior and lateral joint spaces). She exhibits no swelling, no crepitus, no deformity and normal strength.  Unable to visualize or palpate right wrist abnormality today.   Neurological: She is alert and oriented to person, place, and time.  Skin: Skin is warm and dry. She is not diaphoretic.  Psychiatric: She has a normal mood and affect. Her behavior is normal. Judgment and thought content normal.  Vitals reviewed.     BP 100/70   Pulse 91   Wt 168 lb (76.2 kg)   LMP 01/25/2016   SpO2 98%   BMI 30.48 kg/m  Wt Readings from Last 3 Encounters:  02/25/16 168 lb (76.2 kg)  01/25/16 171 lb 4 oz (77.7 kg)  01/21/15 163 lb 4 oz (74 kg)       Assessment & Plan:  1. Right shoulder pain - suspect impingement syndrome, discussed treatment options with patient. She would like to treat with NSAIDs, home exercises for a couple of weeks then if no improvement, refer to ortho - meloxicam (MOBIC) 15 MG tablet; Take 1 tablet (15  mg total) by mouth daily.  Dispense: 30 tablet; Refill: 0 - She was instructed to take meloxicam daily for 7-10 days then daily as needed  2. Ganglion cyst - exam is normal today, but sounds like a ganglion cyst. Continue brace prn, NSAIDs for pain   Clarene Reamer, FNP-BC  [facility], [place]  02/25/2016 11:57 AM

## 2016-03-28 ENCOUNTER — Other Ambulatory Visit: Payer: Self-pay | Admitting: Family Medicine

## 2016-03-28 ENCOUNTER — Encounter: Payer: Self-pay | Admitting: Family Medicine

## 2016-03-28 DIAGNOSIS — G8929 Other chronic pain: Secondary | ICD-10-CM

## 2016-03-28 DIAGNOSIS — M25511 Pain in right shoulder: Principal | ICD-10-CM

## 2016-03-28 MED ORDER — MELOXICAM 15 MG PO TABS: 15.0000 mg | ORAL_TABLET | Freq: Every day | ORAL | 0 refills | Status: DC

## 2016-06-02 ENCOUNTER — Telehealth: Payer: BLUE CROSS/BLUE SHIELD | Admitting: Family

## 2016-06-02 DIAGNOSIS — B9689 Other specified bacterial agents as the cause of diseases classified elsewhere: Secondary | ICD-10-CM

## 2016-06-02 DIAGNOSIS — J329 Chronic sinusitis, unspecified: Secondary | ICD-10-CM

## 2016-06-02 MED ORDER — AMOXICILLIN-POT CLAVULANATE 875-125 MG PO TABS: 1.0000 | ORAL_TABLET | Freq: Two times a day (BID) | ORAL | 0 refills | Status: AC

## 2016-06-02 NOTE — Progress Notes (Signed)

## 2017-01-25 ENCOUNTER — Encounter: Payer: BLUE CROSS/BLUE SHIELD | Admitting: Family Medicine

## 2017-02-14 ENCOUNTER — Ambulatory Visit (INDEPENDENT_AMBULATORY_CARE_PROVIDER_SITE_OTHER): Payer: BLUE CROSS/BLUE SHIELD | Admitting: Family Medicine

## 2017-02-14 ENCOUNTER — Other Ambulatory Visit (INDEPENDENT_AMBULATORY_CARE_PROVIDER_SITE_OTHER): Payer: BLUE CROSS/BLUE SHIELD

## 2017-02-14 VITALS — BP 104/70 | HR 80 | Temp 98.3°F | Ht 62.5 in | Wt 175.0 lb

## 2017-02-14 DIAGNOSIS — Z8639 Personal history of other endocrine, nutritional and metabolic disease: Secondary | ICD-10-CM

## 2017-02-14 DIAGNOSIS — M25511 Pain in right shoulder: Secondary | ICD-10-CM

## 2017-02-14 DIAGNOSIS — K581 Irritable bowel syndrome with constipation: Secondary | ICD-10-CM | POA: Diagnosis not present

## 2017-02-14 DIAGNOSIS — G8929 Other chronic pain: Secondary | ICD-10-CM

## 2017-02-14 DIAGNOSIS — E559 Vitamin D deficiency, unspecified: Secondary | ICD-10-CM | POA: Diagnosis not present

## 2017-02-14 DIAGNOSIS — Z01419 Encounter for gynecological examination (general) (routine) without abnormal findings: Secondary | ICD-10-CM | POA: Diagnosis not present

## 2017-02-14 LAB — COMPREHENSIVE METABOLIC PANEL
ALT: 15 U/L (ref 0–35)
AST: 18 U/L (ref 0–37)
Albumin: 3.9 g/dL (ref 3.5–5.2)
Alkaline Phosphatase: 66 U/L (ref 39–117)
BILIRUBIN TOTAL: 0.2 mg/dL (ref 0.2–1.2)
BUN: 13 mg/dL (ref 6–23)
CALCIUM: 9.9 mg/dL (ref 8.4–10.5)
CHLORIDE: 106 meq/L (ref 96–112)
CO2: 28 mEq/L (ref 19–32)
Creatinine, Ser: 0.92 mg/dL (ref 0.40–1.20)
GFR: 83.69 mL/min (ref >60.00)
Glucose, Bld: 91 mg/dL (ref 70–99)
Potassium: 4.2 mEq/L (ref 3.5–5.1)
Sodium: 139 mEq/L (ref 135–145)
TOTAL PROTEIN: 7 g/dL (ref 6.0–8.3)

## 2017-02-14 LAB — CBC WITH DIFFERENTIAL/PLATELET
Basophils Absolute: 0 10*3/uL (ref 0.0–0.1)
Basophils Relative: 0.7 % (ref 0.0–3.0)
Eosinophils Absolute: 0 10*3/uL (ref 0.0–0.7)
Eosinophils Relative: 0.7 % (ref 0.0–5.0)
HEMATOCRIT: 37.5 % (ref 36.0–46.0)
Hemoglobin: 11.9 g/dL — ABNORMAL LOW (ref 12.0–15.0)
Lymphocytes Relative: 45.5 % (ref 12.0–46.0)
Lymphs Abs: 2.8 10*3/uL (ref 0.7–4.0)
MCHC: 31.9 g/dL (ref 30.0–36.0)
MCV: 86.8 fl (ref 78.0–100.0)
MONO ABS: 0.4 10*3/uL (ref 0.1–1.0)
MONOS PCT: 5.7 % (ref 3.0–12.0)
Neutro Abs: 2.9 10*3/uL (ref 1.4–7.7)
Neutrophils Relative %: 47.4 % (ref 43.0–77.0)
Platelets: 249 10*3/uL (ref 150.0–400.0)
RBC: 4.32 Mil/uL (ref 3.87–5.11)
RDW: 14.5 % (ref 11.5–15.5)
WBC: 6.1 10*3/uL (ref 4.0–10.5)

## 2017-02-14 LAB — LIPID PANEL
Cholesterol: 174 mg/dL (ref 0–200)
HDL: 99.5 mg/dL (ref >39.00)
LDL Cholesterol: 61 mg/dL (ref 0–99)
NonHDL: 74.26
Total CHOL/HDL Ratio: 2
Triglycerides: 66 mg/dL (ref 0.0–149.0)
VLDL: 13.2 mg/dL (ref 0.0–40.0)

## 2017-02-14 LAB — TSH: TSH: 1.58 u[IU]/mL (ref 0.35–4.50)

## 2017-02-14 MED ORDER — LINACLOTIDE 145 MCG PO CAPS: 145.0000 ug | ORAL_CAPSULE | Freq: Every day | ORAL | 3 refills | Status: DC

## 2017-02-14 MED ORDER — MELOXICAM 15 MG PO TABS: 15.0000 mg | ORAL_TABLET | Freq: Every day | ORAL | 3 refills | Status: DC

## 2017-02-14 NOTE — Assessment & Plan Note (Signed)
Deteriorated. eRx sent for linzess- she would like to try it again. Call or return to clinic prn if these symptoms worsen or fail to improve as anticipated. The patient indicates understanding of these issues and agrees with the plan.

## 2017-02-14 NOTE — Assessment & Plan Note (Signed)
Reviewed preventive care protocols, scheduled due services, and updated immunizations Discussed nutrition, exercise, diet, and healthy lifestyle.  Orders Placed This Encounter  Procedures  . CBC with Differential/Platelet  . Comprehensive metabolic panel  . Lipid panel  . TSH     

## 2017-02-14 NOTE — Progress Notes (Signed)
Subjective:   Patient ID: Sydney [name], female    DOB: [birth year], 46 y.o.   MRN: [id number]  Sydney [name] is a pleasant 46 y.o. year old female who presents to clinic today with Annual Exam (Has a lump on her wrist that was there last year. Says it is intermittent. Sees GYN.) and Constipation (Would like to go back on Linzess)  on 02/14/2017  HPI:   Has a GYN- Dr. Corinna Capra- last pap smear and mammogram in in 02/2016.  Has appointment scheduled for next month.  Due for labs.  IBS with constipation- has seen Dr. Olevia Perches. Colonoscopy neg. Taking OTC laxatives but remains constipated.   She would like to try Linzess again.  Lab Results  Component Value Date   WBC 6.9 01/25/2016   HGB 11.9 (L) 01/25/2016   HCT 36.5 01/25/2016   MCV 84.3 01/25/2016   PLT 273.0 01/25/2016   Lab Results  Component Value Date   CHOL 166 01/25/2016   HDL 68.00 01/25/2016   LDLCALC 77 01/25/2016   TRIG 106.0 01/25/2016   CHOLHDL 2 01/25/2016   Lab Results  Component Value Date   CREATININE 0.91 01/25/2016   Lab Results  Component Value Date   NA 138 01/25/2016   K 3.9 01/25/2016   CL 106 01/25/2016   CO2 29 01/25/2016   Lab Results  Component Value Date   ALT 10 01/25/2016   AST 17 01/25/2016   ALKPHOS 71 01/25/2016   BILITOT 0.2 01/25/2016   Lab Results  Component Value Date   TSH 0.95 01/25/2016    Current Outpatient Prescriptions on File Prior to Visit  Medication Sig Dispense Refill  . Cholecalciferol (VITAMIN D3) 3000 UNITS TABS Take 1 capsule by mouth daily.    Marland Kitchen ibuprofen (ADVIL,MOTRIN) 800 MG tablet Take 800 mg by mouth every 4 (four) hours as needed. For cramps-- pt says she does take every 4-6 hours if needed for major cramping    . OVER THE COUNTER MEDICATION Take 2 tablets by mouth daily as needed. fennoside herbal laxative for contipation    . pseudoephedrine (SUDAFED) 30 MG tablet Take 60 mg by mouth daily as needed. As needed for sinus  migraine.  Take with excedrin     No current facility-administered medications on file prior to visit.     No Known Allergies  Past Medical History:  Diagnosis Date  . Abnormal bleeding in menstrual cycle   . Fibroids   . Headache(784.0)    otc med prn  . Hemorrhoids   . IBS (irritable bowel syndrome)     Past Surgical History:  Procedure Laterality Date  . COLONOSCOPY    . HYSTEROSCOPY W/D&C  10/14/2011   Procedure: DILATATION AND CURETTAGE /HYSTEROSCOPY;  Surgeon: Luz Lex, MD;  Location: [facility];  Service: Gynecology;  Laterality: N/A;  WITH TRUCLEAR morcellation  . INDUCED ABORTION    . MYOMECTOMY  2013  . UPPER GASTROINTESTINAL ENDOSCOPY    . UTERINE FIBROID SURGERY  08/13/2015   Dr. Corinna Capra  . WISDOM TOOTH EXTRACTION      Family History  Problem Relation Age of Onset  . Heart disease Maternal Grandmother   . Hypertension Paternal Grandmother   . Hypertension Paternal Grandfather     Social History   Social History  . Marital status: Married    Spouse name: N/A  . Number of children: N/A  . Years of education: N/A   Occupational History  . Not on file.  Social History Main Topics  . Smoking status: Never Smoker  . Smokeless tobacco: Never Used  . Alcohol use Yes     Comment: wine - socially  . Drug use: No  . Sexual activity: Yes    Birth control/ protection: Pill   Other Topics Concern  . Not on file   Social History Narrative  . No narrative on file   The PMH, PSH, Social History, Family History, Medications, and allergies have been reviewed in [facility], and have been updated if relevant.  Review of Systems  Constitutional: Negative.   HENT: Negative.   Eyes: Negative.   Respiratory: Negative.   Cardiovascular: Negative.   Gastrointestinal: Positive for constipation.  Endocrine: Negative.   Genitourinary: Negative.   Musculoskeletal: Negative.   Skin: Negative.   Allergic/Immunologic: Negative.   Neurological: Negative.   Hematological:  Negative.   Psychiatric/Behavioral: Negative.   All other systems reviewed and are negative.      Objective:    BP 104/70 (BP Location: Left Arm, Patient Position: Sitting, Cuff Size: Large)   Pulse 80   Temp 98.3 F (36.8 C) (Oral)   Ht 5' 2.5" (1.588 m)   Wt 175 lb (79.4 kg)   SpO2 97%   BMI 31.50 kg/m    Physical Exam  Constitutional: She is oriented to person, place, and time. She appears well-developed and well-nourished. No distress.  HENT:  Head: Normocephalic and atraumatic.  Eyes: Conjunctivae are normal.  Neck: Normal range of motion.  Cardiovascular: Normal rate, regular rhythm and normal heart sounds.   Pulmonary/Chest: Effort normal and breath sounds normal. No respiratory distress. She has no wheezes.  Abdominal: Soft.  Musculoskeletal: Normal range of motion. She exhibits no edema.  Neurological: She is alert and oriented to person, place, and time. No cranial nerve deficit.  Skin: Skin is warm and dry.  Psychiatric: She has a normal mood and affect. Her behavior is normal. Judgment and thought content normal.  Nursing note and vitals reviewed.         Assessment & Plan:   Well woman exam - Plan: CBC with Differential/Platelet, Comprehensive metabolic panel, Lipid panel, TSH  Irritable bowel syndrome with constipation  Chronic right shoulder pain - Plan: meloxicam (MOBIC) 15 MG tablet No Follow-up on file.

## 2017-02-15 LAB — VITAMIN B12: Vitamin B-12: 677 pg/mL (ref 211–911)

## 2017-02-15 LAB — VITAMIN D 25 HYDROXY (VIT D DEFICIENCY, FRACTURES): VITD: 29.43 ng/mL — ABNORMAL LOW (ref 30.00–100.00)

## 2017-02-16 ENCOUNTER — Other Ambulatory Visit: Payer: Self-pay | Admitting: Family Medicine

## 2017-02-17 ENCOUNTER — Encounter: Payer: Self-pay | Admitting: Family Medicine

## 2017-02-20 ENCOUNTER — Encounter: Payer: Self-pay | Admitting: Family Medicine

## 2017-02-20 ENCOUNTER — Other Ambulatory Visit: Payer: Self-pay | Admitting: Family Medicine

## 2017-02-20 MED ORDER — SCOPOLAMINE 1 MG/3DAYS TD PT72: 1.0000 | MEDICATED_PATCH | TRANSDERMAL | 12 refills | Status: DC

## 2017-02-20 MED ORDER — SCOPOLAMINE 1 MG/3DAYS TD PT72: 1.0000 | MEDICATED_PATCH | TRANSDERMAL | 3 refills | Status: DC

## 2017-02-21 ENCOUNTER — Encounter: Payer: BLUE CROSS/BLUE SHIELD | Admitting: Family Medicine

## 2017-12-11 ENCOUNTER — Encounter (INDEPENDENT_AMBULATORY_CARE_PROVIDER_SITE_OTHER): Payer: Self-pay

## 2017-12-14 ENCOUNTER — Telehealth: Payer: Self-pay | Admitting: Family Medicine

## 2017-12-14 NOTE — Telephone Encounter (Signed)
Copied from [place] [id number]. Topic: Inquiry >> Dec 14, 2017  3:41 PM Cecelia Byars, NT wrote: Reason for CRM: Patient would like a call to explain to her why she has to have a toc visit ,with Carlean Purl since she has seen her before , and why was appointment cancelled , please call her [phone]

## 2017-12-15 NOTE — Telephone Encounter (Signed)
I spoke with pt and explained that TOC was a time for the provider to review her health history and discuss her health concerns, diagnosis and medication needs. She said it is just a way to collect more money. She was angry and said she would stay with Dr. Deborra Medina instead of going thru this mess. She is set up for cpe with Dr. Deborra Medina at [place].

## 2018-02-16 ENCOUNTER — Encounter: Payer: BLUE CROSS/BLUE SHIELD | Admitting: Family Medicine

## 2018-02-19 ENCOUNTER — Encounter: Payer: BLUE CROSS/BLUE SHIELD | Admitting: Family Medicine

## 2018-02-21 ENCOUNTER — Encounter: Payer: BLUE CROSS/BLUE SHIELD | Admitting: Family Medicine

## 2018-03-08 ENCOUNTER — Telehealth: Payer: Self-pay | Admitting: Family Medicine

## 2018-03-08 ENCOUNTER — Encounter: Payer: BLUE CROSS/BLUE SHIELD | Admitting: Family Medicine

## 2018-03-08 NOTE — Telephone Encounter (Signed)
Okay with me 

## 2018-03-08 NOTE — Telephone Encounter (Signed)
That is fine with me. Sending to clerical pool to schedule.

## 2018-03-08 NOTE — Telephone Encounter (Signed)
Pt request to transfer from Dr. Deborra Medina to Tor Netters. Both PCP please advise.     Copied from [place] [phone]. Topic: Quick Communication - See Telephone Encounter >> Mar 08, 2018 11:40 AM Hewitt Shorts wrote: Pt states that she has not been able to get in to seen aron for over a year and she would like to transfer back to [place] and schedule a physical with Tor Netters  Best number to call [phone]

## 2018-03-09 NOTE — Telephone Encounter (Signed)
Left message asking pt to call office  °

## 2018-03-22 ENCOUNTER — Ambulatory Visit (INDEPENDENT_AMBULATORY_CARE_PROVIDER_SITE_OTHER): Payer: BLUE CROSS/BLUE SHIELD | Admitting: Family Medicine

## 2018-03-22 ENCOUNTER — Encounter: Payer: Self-pay | Admitting: Family Medicine

## 2018-03-22 VITALS — BP 130/84 | HR 84 | Temp 98.5°F | Ht 62.5 in | Wt 179.8 lb

## 2018-03-22 DIAGNOSIS — Z23 Encounter for immunization: Secondary | ICD-10-CM | POA: Diagnosis not present

## 2018-03-22 DIAGNOSIS — Z793 Long term (current) use of hormonal contraceptives: Secondary | ICD-10-CM

## 2018-03-22 DIAGNOSIS — Z0000 Encounter for general adult medical examination without abnormal findings: Secondary | ICD-10-CM

## 2018-03-22 DIAGNOSIS — E559 Vitamin D deficiency, unspecified: Secondary | ICD-10-CM

## 2018-03-22 DIAGNOSIS — D649 Anemia, unspecified: Secondary | ICD-10-CM | POA: Diagnosis not present

## 2018-03-22 LAB — CBC WITH DIFFERENTIAL/PLATELET
BASOS PCT: 0.9 % (ref 0.0–3.0)
Basophils Absolute: 0 10*3/uL (ref 0.0–0.1)
EOS PCT: 0.9 % (ref 0.0–5.0)
Eosinophils Absolute: 0 10*3/uL (ref 0.0–0.7)
HEMATOCRIT: 39 % (ref 36.0–46.0)
Hemoglobin: 12.8 g/dL (ref 12.0–15.0)
LYMPHS PCT: 36 % (ref 12.0–46.0)
Lymphs Abs: 1.8 10*3/uL (ref 0.7–4.0)
MCHC: 33 g/dL (ref 30.0–36.0)
MCV: 84.8 fl (ref 78.0–100.0)
Monocytes Absolute: 0.3 10*3/uL (ref 0.1–1.0)
Monocytes Relative: 5.3 % (ref 3.0–12.0)
Neutro Abs: 2.8 10*3/uL (ref 1.4–7.7)
Neutrophils Relative %: 56.9 % (ref 43.0–77.0)
Platelets: 304 10*3/uL (ref 150.0–400.0)
RBC: 4.59 Mil/uL (ref 3.87–5.11)
RDW: 14.2 % (ref 11.5–15.5)
WBC: 4.9 10*3/uL (ref 4.0–10.5)

## 2018-03-22 LAB — LIPID PANEL
CHOLESTEROL: 195 mg/dL (ref 0–200)
HDL: 79.9 mg/dL (ref >39.00)
LDL CALC: 97 mg/dL (ref 0–99)
NonHDL: 114.95
Total CHOL/HDL Ratio: 2
Triglycerides: 92 mg/dL (ref 0.0–149.0)
VLDL: 18.4 mg/dL (ref 0.0–40.0)

## 2018-03-22 LAB — TSH: TSH: 1.34 u[IU]/mL (ref 0.35–4.50)

## 2018-03-22 LAB — VITAMIN D 25 HYDROXY (VIT D DEFICIENCY, FRACTURES): VITD: 40.07 ng/mL (ref 30.00–100.00)

## 2018-03-22 NOTE — Assessment & Plan Note (Signed)
Reviewed preventive care protocols, scheduled due services, and updated immunizations Discussed nutrition, exercise, diet, and healthy lifestyle.  

## 2018-03-22 NOTE — Progress Notes (Signed)
Subjective:   Patient ID: Sydney [name], female    DOB: [birth year], 47 y.o.   MRN: [id number]  Sydney [name] is a pleasant 47 y.o. year old female who presents to clinic today with Annual Exam (Patient is here today for a CPE without PAP.  Pap and Mammogram are taken care of by Dr. Corinna Capra.  She is currently fasting.  She agrees to get her flu shot today.)  on 03/22/2018  HPI:  Health Maintenance  Topic Date Due  . INFLUENZA VACCINE  12/28/2017  . PAP SMEAR  04/03/2020  . TETANUS/TDAP  10/25/2020  . HIV Screening  Discontinued   Has GYN- Dr. Corinna Capra- pap and mammogram UTD. Has had two myomectomy and restarted on OCPs but periods heavy. Sees Dr. Corinna Capra at the end of November 2019.  Current Outpatient Medications on File Prior to Visit  Medication Sig Dispense Refill  . B Complex-C (B-COMPLEX WITH VITAMIN C) tablet Take 1 tablet by mouth daily.    . cetirizine (ZYRTEC) 10 MG tablet Take 10 mg by mouth daily.    . ferrous sulfate 325 (65 FE) MG EC tablet Take 325 mg by mouth daily.    Marland Kitchen ibuprofen (ADVIL,MOTRIN) 800 MG tablet Take 800 mg by mouth every 4 (four) hours as needed. For cramps-- pt says she does take every 4-6 hours if needed for major cramping    . pseudoephedrine (SUDAFED) 30 MG tablet Take 60 mg by mouth daily as needed. As needed for sinus migraine.  Take with excedrin    . SPRINTEC 28 0.25-35 MG-MCG tablet Take 1 tablet by mouth daily.  12  . Vitamin D, Ergocalciferol, 2000 units CAPS Take 1 capsule by mouth daily.     No current facility-administered medications on file prior to visit.     No Known Allergies  Past Medical History:  Diagnosis Date  . Abnormal bleeding in menstrual cycle   . Fibroids   . Headache(784.0)    otc med prn  . Hemorrhoids   . IBS (irritable bowel syndrome)     Past Surgical History:  Procedure Laterality Date  . COLONOSCOPY    . HYSTEROSCOPY W/D&C  10/14/2011   Procedure: DILATATION AND CURETTAGE /HYSTEROSCOPY;   Surgeon: Luz Lex, MD;  Location: [facility];  Service: Gynecology;  Laterality: N/A;  WITH TRUCLEAR morcellation  . INDUCED ABORTION    . MYOMECTOMY  2013  . UPPER GASTROINTESTINAL ENDOSCOPY    . UTERINE FIBROID SURGERY  08/13/2015   Dr. Corinna Capra  . WISDOM TOOTH EXTRACTION      Family History  Problem Relation Age of Onset  . Heart disease Maternal Grandmother   . Hypertension Paternal Grandmother   . Hypertension Paternal Grandfather     Social History   Socioeconomic History  . Marital status: Married    Spouse name: Not on file  . Number of children: Not on file  . Years of education: Not on file  . Highest education level: Not on file  Occupational History  . Not on file  Social Needs  . Financial resource strain: Not on file  . Food insecurity:    Worry: Not on file    Inability: Not on file  . Transportation needs:    Medical: Not on file    Non-medical: Not on file  Tobacco Use  . Smoking status: Never Smoker  . Smokeless tobacco: Never Used  Substance and Sexual Activity  . Alcohol use: Yes    Comment: wine -  socially  . Drug use: No  . Sexual activity: Yes    Birth control/protection: Pill  Lifestyle  . Physical activity:    Days per week: Not on file    Minutes per session: Not on file  . Stress: Not on file  Relationships  . Social connections:    Talks on phone: Not on file    Gets together: Not on file    Attends religious service: Not on file    Active member of club or organization: Not on file    Attends meetings of clubs or organizations: Not on file    Relationship status: Not on file  . Intimate partner violence:    Fear of current or ex partner: Not on file    Emotionally abused: Not on file    Physically abused: Not on file    Forced sexual activity: Not on file  Other Topics Concern  . Not on file  Social History Narrative  . Not on file   The PMH, PSH, Social History, Family History, Medications, and allergies have been reviewed in  [facility], and have been updated if relevant.  Review of Systems  Constitutional: Negative.   HENT: Negative.   Eyes: Negative.   Respiratory: Negative.   Cardiovascular: Negative.   Gastrointestinal: Negative.   Endocrine: Negative.   Genitourinary: Negative.   Musculoskeletal: Negative.   Allergic/Immunologic: Negative.   Neurological: Negative.   Hematological: Negative.   Psychiatric/Behavioral: Negative.   All other systems reviewed and are negative.      Objective:    BP 130/84 (BP Location: Left Arm, Patient Position: Sitting, Cuff Size: Normal)   Pulse 84   Temp 98.5 F (36.9 C) (Oral)   Ht 5' 2.5" (1.588 m)   Wt 179 lb 12.8 oz (81.6 kg)   LMP 03/18/2018   SpO2 98%   BMI 32.36 kg/m    Physical Exam   General:  Well-developed,well-nourished,in no acute distress; alert,appropriate and cooperative throughout examination Head:  normocephalic and atraumatic.   Eyes:  vision grossly intact, PERRL Ears:  R ear normal and L ear normal externally, TMs clear bilaterally Nose:  no external deformity.   Mouth:  good dentition.   Neck:  No deformities, masses, or tenderness noted. Lungs:  Normal respiratory effort, chest expands symmetrically. Lungs are clear to auscultation, no crackles or wheezes. Heart:  Normal rate and regular rhythm. S1 and S2 normal without gallop, murmur, click, rub or other extra sounds. Abdomen:  Bowel sounds positive,abdomen soft and non-tender without masses, organomegaly or hernias noted. Msk:  No deformity or scoliosis noted of thoracic or lumbar spine.   Extremities:  No clubbing, cyanosis, edema, or deformity noted with normal full range of motion of all joints.   Neurologic:  alert & oriented X3 and gait normal.   Skin:  Intact without suspicious lesions or rashes Cervical Nodes:  No lymphadenopathy noted Axillary Nodes:  No palpable lymphadenopathy Psych:  Cognition and judgment appear intact. Alert and cooperative with normal attention span  and concentration. No apparent delusions, illusions, hallucinations       Assessment & Plan:   Well woman exam without gynecological exam  Vitamin D deficiency - Plan: Vitamin D (25 hydroxy)  Need for influenza vaccination - Plan: Flu Vaccine QUAD 6+ mos PF IM (Fluarix Quad PF)  Anemia, unspecified type - Plan: CBC with Differential/Platelet, TSH  Long term (current) use of hormonal contraceptives - Plan: Lipid panel No follow-ups on file.

## 2018-03-22 NOTE — Patient Instructions (Addendum)
Great to see you. I will call you with your lab results from today and you can view them online.   Talk to Dr. Corinna Capra about [place].

## 2018-04-02 ENCOUNTER — Encounter

## 2018-04-02 ENCOUNTER — Encounter: Payer: BLUE CROSS/BLUE SHIELD | Admitting: Family Medicine

## 2018-06-20 ENCOUNTER — Telehealth: Payer: BLUE CROSS/BLUE SHIELD | Admitting: Family

## 2018-06-20 DIAGNOSIS — R399 Unspecified symptoms and signs involving the genitourinary system: Secondary | ICD-10-CM | POA: Diagnosis not present

## 2018-06-20 MED ORDER — CEPHALEXIN 500 MG PO CAPS: 500.0000 mg | ORAL_CAPSULE | Freq: Two times a day (BID) | ORAL | 0 refills | Status: DC

## 2018-06-20 NOTE — Progress Notes (Signed)

## 2018-11-30 ENCOUNTER — Encounter (INDEPENDENT_AMBULATORY_CARE_PROVIDER_SITE_OTHER): Payer: BC Managed Care – PPO | Admitting: Family Medicine

## 2018-11-30 DIAGNOSIS — X158XXA Contact with other hot household appliances, initial encounter: Secondary | ICD-10-CM

## 2018-11-30 DIAGNOSIS — T2006XA Burn of unspecified degree of forehead and cheek, initial encounter: Secondary | ICD-10-CM | POA: Diagnosis not present

## 2018-11-30 DIAGNOSIS — T300 Burn of unspecified body region, unspecified degree: Secondary | ICD-10-CM

## 2018-12-01 MED ORDER — SILVER SULFADIAZINE 1 % EX CREA: 1.0000 "application " | TOPICAL_CREAM | Freq: Every day | CUTANEOUS | 0 refills | Status: DC

## 2018-12-01 NOTE — Assessment & Plan Note (Signed)
See my chart note- Silvadene sent to pharmacy on file.  Supportive care given. Call or send my chart message prn if these symptoms worsen or fail to improve as anticipated. The patient indicates understanding of these issues and agrees with the plan.
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
## 2019-03-28 ENCOUNTER — Encounter: Payer: Self-pay | Admitting: Family Medicine

## 2019-03-29 DIAGNOSIS — D649 Anemia, unspecified: Secondary | ICD-10-CM | POA: Insufficient documentation

## 2019-03-29 NOTE — Progress Notes (Signed)
Subjective:   Patient ID: Sydney [name], female    DOB: 07-04-1970, 48 y.o.   MRN: [id number]  Sydney [name] is a pleasant 48 y.o. year old female who presents to clinic today with Annual Exam (Pt is here today for a CPE. Pt has Gyn Dr. Corinna Capra. Colonoscopy or Cologuard due.  Immunizations are UTD.) and Wrist Pain (Knot on her rt wrist)  on 04/01/2019  HPI:  Health Maintenance  Topic Date Due  . COLONOSCOPY  10/31/2018  . PAP SMEAR-Modifier  04/03/2020  . MAMMOGRAM  04/24/2020  . TETANUS/TDAP  10/25/2020  . INFLUENZA VACCINE  Completed  . HIV Screening  Discontinued   Has GYN- Dr. Corinna Capra- pap and mammogram UTD. Remote h/o of two myomectomies.  Perimenopausal. Sees Dr. Corinna Capra on December 14th- mammogram and pap smear.  Stop OCPs in 07/2018.  Has never had a colonoscopy or cologuard.  Iron deficiency anemia-  Currently taking ferrous sulfate 325 mg daily with breakfast.  Lab Results  Component Value Date   WBC 4.9 03/22/2018   HGB 12.8 03/22/2018   HCT 39.0 03/22/2018   MCV 84.8 03/22/2018   PLT 304.0 03/22/2018   Lab Results  Component Value Date   FERRITIN 17.9 01/25/2016    Lab Results  Component Value Date   CHOL 195 03/22/2018   HDL 79.90 03/22/2018   LDLCALC 97 03/22/2018   TRIG 92.0 03/22/2018   CHOLHDL 2 03/22/2018   Lab Results  Component Value Date   ALT 15 02/14/2017   AST 18 02/14/2017   ALKPHOS 66 02/14/2017   BILITOT 0.2 02/14/2017     Lab Results  Component Value Date   TSH 1.34 03/22/2018   Lab Results  Component Value Date   CREATININE 0.92 02/14/2017   Depression screen PHQ 2/9 04/01/2019 03/22/2018  Decreased Interest 0 0  Down, Depressed, Hopeless 0 0  PHQ - 2 Score 0 0  Altered sleeping 0 -  Tired, decreased energy 0 -  Change in appetite 0 -  Feeling bad or failure about yourself  0 -  Trouble concentrating 0 -  Moving slowly or fidgety/restless 0 -  Suicidal thoughts 0 -  PHQ-9 Score 0 -  Difficult  doing work/chores Not difficult at all -   GAD 7 : Generalized Anxiety Score 04/01/2019  Nervous, Anxious, on Edge 0  Control/stop worrying 0  Worry too much - different things 0  Trouble relaxing 0  Restless 0  Easily annoyed or irritable 0  Afraid - awful might happen 0  Total GAD 7 Score 0  Anxiety Difficulty Not difficult at all      Current Outpatient Medications on File Prior to Visit  Medication Sig Dispense Refill  . B Complex-C (B-COMPLEX WITH VITAMIN C) tablet Take 1 tablet by mouth daily.    . cetirizine (ZYRTEC) 10 MG tablet Take 10 mg by mouth daily.    . ferrous sulfate 325 (65 FE) MG EC tablet Take 325 mg by mouth daily.    Marland Kitchen ibuprofen (ADVIL,MOTRIN) 800 MG tablet Take 800 mg by mouth every 4 (four) hours as needed. For cramps-- pt says she does take every 4-6 hours if needed for major cramping    . pseudoephedrine (SUDAFED) 30 MG tablet Take 60 mg by mouth daily as needed. As needed for sinus migraine.  Take with excedrin    . silver sulfADIAZINE (SILVADENE) 1 % cream Apply 1 application topically daily. 50 g 0  . Vitamin D, Ergocalciferol, 2000 units  CAPS Take 1 capsule by mouth daily.     No current facility-administered medications on file prior to visit.     No Known Allergies  Past Medical History:  Diagnosis Date  . Abnormal bleeding in menstrual cycle   . Fibroids   . Headache(784.0)    otc med prn  . Hemorrhoids   . IBS (irritable bowel syndrome)   . Raised intraocular pressure of both eyes    followed yearly by Dr. Katy Fitch    Past Surgical History:  Procedure Laterality Date  . COLONOSCOPY    . HYSTEROSCOPY W/D&C  10/14/2011   Procedure: DILATATION AND CURETTAGE /HYSTEROSCOPY;  Surgeon: Luz Lex, MD;  Location: [facility];  Service: Gynecology;  Laterality: N/A;  WITH TRUCLEAR morcellation  . INDUCED ABORTION    . MYOMECTOMY  2013  . UPPER GASTROINTESTINAL ENDOSCOPY    . UTERINE FIBROID SURGERY  08/13/2015   Dr. Corinna Capra  . WISDOM TOOTH EXTRACTION       Family History  Problem Relation Age of Onset  . Heart disease Maternal Grandmother   . Hypertension Paternal Grandmother   . Hypertension Paternal Grandfather     Social History   Socioeconomic History  . Marital status: Married    Spouse name: Not on file  . Number of children: Not on file  . Years of education: Not on file  . Highest education level: Not on file  Occupational History  . Not on file  Social Needs  . Financial resource strain: Not on file  . Food insecurity    Worry: Not on file    Inability: Not on file  . Transportation needs    Medical: Not on file    Non-medical: Not on file  Tobacco Use  . Smoking status: Never Smoker  . Smokeless tobacco: Never Used  Substance and Sexual Activity  . Alcohol use: Yes    Comment: wine - socially  . Drug use: No  . Sexual activity: Yes    Birth control/protection: Pill  Lifestyle  . Physical activity    Days per week: Not on file    Minutes per session: Not on file  . Stress: Not on file  Relationships  . Social Herbalist on phone: Not on file    Gets together: Not on file    Attends religious service: Not on file    Active member of club or organization: Not on file    Attends meetings of clubs or organizations: Not on file    Relationship status: Not on file  . Intimate partner violence    Fear of current or ex partner: Not on file    Emotionally abused: Not on file    Physically abused: Not on file    Forced sexual activity: Not on file  Other Topics Concern  . Not on file  Social History Narrative  . Not on file   The PMH, PSH, Social History, Family History, Medications, and allergies have been reviewed in [facility], and have been updated if relevant.  Review of Systems  Constitutional: Negative.   HENT: Negative.   Eyes: Negative.   Respiratory: Negative.   Cardiovascular: Negative.   Gastrointestinal: Negative.   Endocrine: Negative.   Genitourinary: Negative.    Musculoskeletal: Negative.   Allergic/Immunologic: Negative.   Neurological: Negative.   Hematological: Negative.   Psychiatric/Behavioral: Negative.   All other systems reviewed and are negative.      Objective:    BP 110/80 (BP  Location: Right Arm, Patient Position: Sitting, Cuff Size: Normal)   Pulse 83   Temp 99 F (37.2 C) (Oral)   Ht 5' 2.5" (1.588 m)   Wt 184 lb (83.5 kg)   LMP 03/15/2019   SpO2 98%   BMI 33.12 kg/m    Physical Exam Vitals signs reviewed.  Constitutional:      General: She is not in acute distress.    Appearance: Normal appearance. She is normal weight. She is not ill-appearing.  HENT:     Head: Normocephalic and atraumatic.     Right Ear: Ear canal normal.     Left Ear: Ear canal normal.     Nose: Nose normal.     Mouth/Throat:     Mouth: Mucous membranes are moist.  Eyes:     Extraocular Movements: Extraocular movements intact.  Neck:     Musculoskeletal: Normal range of motion.  Cardiovascular:     Rate and Rhythm: Normal rate and regular rhythm.     Pulses: Normal pulses.     Heart sounds: Normal heart sounds.  Pulmonary:     Effort: Pulmonary effort is normal.     Breath sounds: Normal breath sounds.  Abdominal:     General: Abdomen is flat. Bowel sounds are normal.     Palpations: Abdomen is soft.  Musculoskeletal: Normal range of motion.  Skin:    General: Skin is warm and dry.  Neurological:     General: No focal deficit present.     Mental Status: She is alert and oriented to person, place, and time.  Psychiatric:        Mood and Affect: Mood normal.        Behavior: Behavior normal.        Thought Content: Thought content normal.        Judgment: Judgment normal.             Assessment & Plan:   Well woman exam without gynecological exam  Vitamin D deficiency - Plan: Vitamin D (25 hydroxy)  Other iron deficiency anemia - Plan: Iron,Total/Total Iron Binding Cap, CBC with Differential/Platelet, Ferritin   Long term (current) use of hormonal contraceptives - Plan: Hemoglobin A1c, TSH, Lipid panel No follow-ups on file.

## 2019-03-29 NOTE — Progress Notes (Signed)
Travel or Contacts:   Any travel in the past 2 weeks?  NO  Have you came in contact with anyone who has Covid? NO  Have you had a positive Covid test? If so, when?NO  Fever >100.59F []   Yes [x]   No []   Unknown  Chills []   Yes [x]   No []   Unknown  Muscle aches (myalgia) []   Yes [x]   No []   Unknown  Runny nose (rhinorrhea) []   Yes [x]   No []   Unknown  Sore throat []   Yes [x]   No []   Unknown Cough (new onset or worsening of chronic cough) []   Yes [x]   No []   Unknown  Shortness of breath (dyspnea) []   Yes [x]   No []   Unknown Nausea or vomiting []   Yes [x]   No []   Unknown  Headache []   Yes [x]   No []   Unknown  Abdominal pain  []   Yes [x]   No []   Unknown  Diarrhea (?3 loose/looser than normal stools/24hr period) []   Yes [x]   No []   Unknown Other, s:pecify

## 2019-04-01 ENCOUNTER — Encounter: Payer: Self-pay | Admitting: Family Medicine

## 2019-04-01 ENCOUNTER — Other Ambulatory Visit: Payer: Self-pay

## 2019-04-01 ENCOUNTER — Ambulatory Visit (INDEPENDENT_AMBULATORY_CARE_PROVIDER_SITE_OTHER): Payer: BC Managed Care – PPO | Admitting: Family Medicine

## 2019-04-01 VITALS — BP 110/80 | HR 83 | Temp 99.0°F | Ht 62.5 in | Wt 184.0 lb

## 2019-04-01 DIAGNOSIS — Z793 Long term (current) use of hormonal contraceptives: Secondary | ICD-10-CM | POA: Diagnosis not present

## 2019-04-01 DIAGNOSIS — Z0000 Encounter for general adult medical examination without abnormal findings: Secondary | ICD-10-CM

## 2019-04-01 DIAGNOSIS — E559 Vitamin D deficiency, unspecified: Secondary | ICD-10-CM

## 2019-04-01 DIAGNOSIS — M67431 Ganglion, right wrist: Secondary | ICD-10-CM | POA: Insufficient documentation

## 2019-04-01 DIAGNOSIS — D508 Other iron deficiency anemias: Secondary | ICD-10-CM | POA: Diagnosis not present

## 2019-04-01 DIAGNOSIS — D649 Anemia, unspecified: Secondary | ICD-10-CM

## 2019-04-01 DIAGNOSIS — N951 Menopausal and female climacteric states: Secondary | ICD-10-CM

## 2019-04-01 LAB — CBC WITH DIFFERENTIAL/PLATELET
Basophils Absolute: 0.1 10*3/uL (ref 0.0–0.1)
Basophils Relative: 1.2 % (ref 0.0–3.0)
Eosinophils Absolute: 0 10*3/uL (ref 0.0–0.7)
Eosinophils Relative: 0.5 % (ref 0.0–5.0)
HCT: 34.1 % — ABNORMAL LOW (ref 36.0–46.0)
Hemoglobin: 10.8 g/dL — ABNORMAL LOW (ref 12.0–15.0)
Lymphocytes Relative: 27.8 % (ref 12.0–46.0)
Lymphs Abs: 1.9 10*3/uL (ref 0.7–4.0)
MCHC: 31.6 g/dL (ref 30.0–36.0)
MCV: 81.6 fl (ref 78.0–100.0)
Monocytes Absolute: 0.4 10*3/uL (ref 0.1–1.0)
Monocytes Relative: 5.3 % (ref 3.0–12.0)
Neutro Abs: 4.4 10*3/uL (ref 1.4–7.7)
Neutrophils Relative %: 65.2 % (ref 43.0–77.0)
Platelets: 284 10*3/uL (ref 150.0–400.0)
RBC: 4.18 Mil/uL (ref 3.87–5.11)
RDW: 14.9 % (ref 11.5–15.5)
WBC: 6.8 10*3/uL (ref 4.0–10.5)

## 2019-04-01 LAB — VITAMIN D 25 HYDROXY (VIT D DEFICIENCY, FRACTURES): VITD: 27.3 ng/mL — ABNORMAL LOW (ref 30.00–100.00)

## 2019-04-01 LAB — LIPID PANEL
Cholesterol: 179 mg/dL (ref 0–200)
HDL: 77.5 mg/dL (ref >39.00)
LDL Cholesterol: 84 mg/dL (ref 0–99)
NonHDL: 101.67
Total CHOL/HDL Ratio: 2
Triglycerides: 90 mg/dL (ref 0.0–149.0)
VLDL: 18 mg/dL (ref 0.0–40.0)

## 2019-04-01 LAB — TSH: TSH: 1.33 u[IU]/mL (ref 0.35–4.50)

## 2019-04-01 LAB — FOLLICLE STIMULATING HORMONE: FSH: 7.4 m[IU]/mL

## 2019-04-01 LAB — FERRITIN: Ferritin: 8.9 ng/mL — ABNORMAL LOW (ref 10.0–291.0)

## 2019-04-01 LAB — HEMOGLOBIN A1C: Hgb A1c MFr Bld: 5.5 % (ref 4.6–6.5)

## 2019-04-01 LAB — LUTEINIZING HORMONE: LH: 8.8 m[IU]/mL

## 2019-04-01 NOTE — Assessment & Plan Note (Signed)
Continue current dose of iron. Check labs today.  Orders Placed This Encounter  Procedures  . Iron,Total/Total Iron Binding Cap  . CBC with Differential/Platelet  . Ferritin  . Vitamin D (25 hydroxy)  . Hemoglobin A1c  . TSH  . Lipid panel

## 2019-04-01 NOTE — Assessment & Plan Note (Signed)
Continue current dose of vitamin D.  Check Vitamin D and calcium today.

## 2019-04-01 NOTE — Assessment & Plan Note (Signed)
Check labs today.

## 2019-04-01 NOTE — Patient Instructions (Signed)
Always so good to see you!! I will call you with your lab results from today and you can view them online.    Cologuard will be mailed to your home.

## 2019-04-01 NOTE — Assessment & Plan Note (Addendum)
Reviewed preventive care protocols, scheduled due services, and updated immunizations Discussed nutrition, exercise, diet, and healthy lifestyle.  Cologuard ordered for patient today.

## 2019-04-02 LAB — IRON,?TOTAL/TOTAL IRON BINDING CAP: TIBC: 454 mcg/dL (calc) — ABNORMAL HIGH (ref 250–450)

## 2019-04-02 LAB — IRON, TOTAL/TOTAL IRON BINDING CAP
%SAT: 6 % (calc) — ABNORMAL LOW (ref 16–45)
Iron: 28 ug/dL — ABNORMAL LOW (ref 45–160)

## 2019-04-03 ENCOUNTER — Other Ambulatory Visit: Payer: Self-pay | Admitting: Family Medicine

## 2019-04-03 MED ORDER — FERROUS SULFATE 325 (65 FE) MG PO TBEC: 325.0000 mg | DELAYED_RELEASE_TABLET | Freq: Three times a day (TID) | ORAL | 3 refills | Status: DC

## 2019-04-03 MED ORDER — VITAMIN D (ERGOCALCIFEROL) 1.25 MG (50000 UNIT) PO CAPS: 50000.0000 [IU] | ORAL_CAPSULE | ORAL | 0 refills | Status: DC

## 2019-04-13 ENCOUNTER — Encounter: Payer: Self-pay | Admitting: Family Medicine

## 2019-04-14 LAB — COLOGUARD: Cologuard: NEGATIVE

## 2019-04-16 ENCOUNTER — Encounter: Payer: Self-pay | Admitting: Family Medicine

## 2019-05-08 ENCOUNTER — Encounter: Payer: Self-pay | Admitting: Family Medicine

## 2019-05-08 DIAGNOSIS — E669 Obesity, unspecified: Secondary | ICD-10-CM | POA: Insufficient documentation

## 2019-05-08 DIAGNOSIS — E66811 Obesity, class 1: Secondary | ICD-10-CM | POA: Insufficient documentation

## 2019-05-08 NOTE — Progress Notes (Signed)
Virtual Visit via Video   Due to the COVID-19 pandemic, this visit was completed with telemedicine (audio/video) technology to reduce patient and provider exposure as well as to preserve personal protective equipment.   I connected with Whitney Muse by a video enabled telemedicine application and verified that I am speaking with the correct person using two identifiers. Location patient: Home Location provider: [facility], Office Persons participating in the virtual visit: Mindi Slicker, MD   I discussed the limitations of evaluation and management by telemedicine and the availability of in person appointments. The patient expressed understanding and agreed to proceed.  Care Team   Patient Care Team: Lucille Passy, MD as PCP - General (Family Medicine)  Subjective:   HPI: Patient is needing to discuss her need for weight loss. Labs from physical came back good. This is a secondary conversation about this. She states that she has made healthy life-style changes along with spouse and feels like she needs additional help medicinally.  Wt Readings from Last 3 Encounters:  05/08/19 184 lb (83.5 kg)  04/01/19 184 lb (83.5 kg)  03/22/18 179 lb 12.8 oz (81.6 kg)     Review of Systems  Constitutional: Negative for fever and malaise/fatigue.  HENT: Negative for congestion and hearing loss.   Eyes: Negative for blurred vision, discharge and redness.  Respiratory: Negative for cough and shortness of breath.   Cardiovascular: Negative for chest pain, palpitations and leg swelling.  Gastrointestinal: Negative for abdominal pain and heartburn.  Genitourinary: Negative for dysuria.  Musculoskeletal: Negative for falls.  Skin: Negative for rash.  Neurological: Negative for loss of consciousness and headaches.  Endo/Heme/Allergies: Does not bruise/bleed easily.  Psychiatric/Behavioral: Negative for depression.  All other systems reviewed and are  negative.    Patient Active Problem List   Diagnosis Date Noted  . Class 1 obesity with body mass index (BMI) of 33.0 to 33.9 in adult 05/08/2019  . Perimenopausal 04/01/2019  . Ganglion cyst of dorsum of right wrist 04/01/2019  . Anemia 03/29/2019  . Well woman exam without gynecological exam 03/22/2018  . IBS (irritable bowel syndrome) 10/15/2013  . Vitamin D deficiency 10/15/2013  . Migraine 08/30/2007  . SLOW TRANSIT CONSTIPATION 08/30/2007    Social History   Tobacco Use  . Smoking status: Never Smoker  . Smokeless tobacco: Never Used  Substance Use Topics  . Alcohol use: Yes    Comment: wine - socially    Current Outpatient Medications:  .  B Complex-C (B-COMPLEX WITH VITAMIN C) tablet, Take 1 tablet by mouth daily., Disp: , Rfl:  .  cetirizine (ZYRTEC) 10 MG tablet, Take 10 mg by mouth daily., Disp: , Rfl:  .  ferrous sulfate 325 (65 FE) MG EC tablet, Take 1 tablet (325 mg total) by mouth 3 (three) times daily with meals., Disp: 90 tablet, Rfl: 3 .  ibuprofen (ADVIL,MOTRIN) 800 MG tablet, Take 800 mg by mouth every 4 (four) hours as needed. For cramps-- pt says she does take every 4-6 hours if needed for major cramping, Disp: , Rfl:  .  pseudoephedrine (SUDAFED) 30 MG tablet, Take 60 mg by mouth daily as needed. As needed for sinus migraine.  Take with excedrin, Disp: , Rfl:  .  silver sulfADIAZINE (SILVADENE) 1 % cream, Apply 1 application topically daily., Disp: 50 g, Rfl: 0 .  Vitamin D, Ergocalciferol, (DRISDOL) 1.25 MG (50000 UT) CAPS capsule, Take 1 capsule (50,000 Units total) by mouth every 7 (seven) days.,  Disp: 12 capsule, Rfl: 0 .  phentermine 15 MG capsule, Take 1 capsule (15 mg total) by mouth every morning., Disp: 30 capsule, Rfl: 1  No Known Allergies  Objective:  BP 121/87   Temp 97.6 F (36.4 C) (Oral)   Ht 5' 2.5" (1.588 m)   Wt 184 lb (83.5 kg)   LMP 04/12/2019   BMI 33.12 kg/m   Wt Readings from Last 3 Encounters:  05/08/19 184 lb (83.5 kg)   04/01/19 184 lb (83.5 kg)  03/22/18 179 lb 12.8 oz (81.6 kg)    VITALS: Per patient if applicable, see vitals. GENERAL: Alert, appears well and in no acute distress. HEENT: Atraumatic, conjunctiva clear, no obvious abnormalities on inspection of external nose and ears. NECK: Normal movements of the head and neck. CARDIOPULMONARY: No increased WOB. Speaking in clear sentences. I:E ratio WNL.  MS: Moves all visible extremities without noticeable abnormality. PSYCH: Pleasant and cooperative, well-groomed. Speech normal rate and rhythm. Affect is appropriate. Insight and judgement are appropriate. Attention is focused, linear, and appropriate.  NEURO: CN grossly intact. Oriented as arrived to appointment on time with no prompting. Moves both UE equally.  SKIN: No obvious lesions, wounds, erythema, or cyanosis noted on face or hands.  Depression screen [facility] 2/9 04/01/2019 03/22/2018  Decreased Interest 0 0  Down, Depressed, Hopeless 0 0  PHQ - 2 Score 0 0  Altered sleeping 0 -  Tired, decreased energy 0 -  Change in appetite 0 -  Feeling bad or failure about yourself  0 -  Trouble concentrating 0 -  Moving slowly or fidgety/restless 0 -  Suicidal thoughts 0 -  PHQ-9 Score 0 -  Difficult doing work/chores Not difficult at all -     . COVID-19 Education: The signs and symptoms of COVID-19 were discussed with the patient and how to seek care for testing if needed. The importance of social distancing was discussed today. . Reviewed expectations re: course of current medical issues. . Discussed self-management of symptoms. . Outlined signs and symptoms indicating need for more acute intervention. . Patient verbalized understanding and all questions were answered. Marland Kitchen Health Maintenance issues including appropriate healthy diet, exercise, and smoking avoidance were discussed with patient. . See orders for this visit as documented in the electronic medical record.  Records requested if needed.  Time spent: 25 minutes, of which >50% was spent in obtaining information about her symptoms, reviewing her previous labs, evaluations, and treatments, counseling her about her condition (please see the discussed topics above), and developing a plan to further investigate it; she had a number of questions which I addressed.   Lab Results  Component Value Date   WBC 6.8 04/01/2019   HGB 10.8 (L) 04/01/2019   HCT 34.1 (L) 04/01/2019   PLT 284.0 04/01/2019   GLUCOSE 91 02/14/2017   CHOL 179 04/01/2019   TRIG 90.0 04/01/2019   HDL 77.50 04/01/2019   LDLCALC 84 04/01/2019   ALT 15 02/14/2017   AST 18 02/14/2017   NA 139 02/14/2017   K 4.2 02/14/2017   CL 106 02/14/2017   CREATININE 0.92 02/14/2017   BUN 13 02/14/2017   CO2 28 02/14/2017   TSH 1.33 04/01/2019   HGBA1C 5.5 04/01/2019    Lab Results  Component Value Date   TSH 1.33 04/01/2019   Lab Results  Component Value Date   WBC 6.8 04/01/2019   HGB 10.8 (L) 04/01/2019   HCT 34.1 (L) 04/01/2019   MCV 81.6 04/01/2019  PLT 284.0 04/01/2019   Lab Results  Component Value Date   NA 139 02/14/2017   K 4.2 02/14/2017   CO2 28 02/14/2017   GLUCOSE 91 02/14/2017   BUN 13 02/14/2017   CREATININE 0.92 02/14/2017   BILITOT 0.2 02/14/2017   ALKPHOS 66 02/14/2017   AST 18 02/14/2017   ALT 15 02/14/2017   PROT 7.0 02/14/2017   ALBUMIN 3.9 02/14/2017   CALCIUM 9.9 02/14/2017   GFR 83.69 02/14/2017   Lab Results  Component Value Date   CHOL 179 04/01/2019   Lab Results  Component Value Date   HDL 77.50 04/01/2019   Lab Results  Component Value Date   LDLCALC 84 04/01/2019   Lab Results  Component Value Date   TRIG 90.0 04/01/2019   Lab Results  Component Value Date   CHOLHDL 2 04/01/2019   Lab Results  Component Value Date   HGBA1C 5.5 04/01/2019       Assessment & Plan:   Problem List Items Addressed This Visit      Active Problems   Class 1 obesity with body mass index (BMI) of 33.0 to 33.9 in adult     Discussed weight loss plan. She would like to work with nutritionist  Pt would also like to discuss medication options- discussed phentermine risk benefits, side effects including HTN, pulmonary HTN, stroke.    She would like to start phentermine and lifestyle changes.  Follow up in 1 month.  If BMI < 27 will decrease to half dose x 1 month then stop        Relevant Medications   phentermine 15 MG capsule   Other Relevant Orders   Amb ref to Medical Nutrition Therapy-MNT      I am having Lawn. Williams-Chuck start on phentermine. I am also having her maintain her ibuprofen, pseudoephedrine, B-complex with vitamin C, cetirizine, silver sulfADIAZINE, Vitamin D (Ergocalciferol), and ferrous sulfate.  Meds ordered this encounter  Medications  . phentermine 15 MG capsule    Sig: Take 1 capsule (15 mg total) by mouth every morning.    Dispense:  30 capsule    Refill:  1     Arnette Norris, MD

## 2019-05-08 NOTE — Assessment & Plan Note (Signed)
Discussed weight loss plan. She would like to work with nutritionist  Pt would also like to discuss medication options- discussed phentermine risk benefits, side effects including HTN, pulmonary HTN, stroke.    She would like to start phentermine and lifestyle changes.  Follow up in 1 month.  If BMI < 27 will decrease to half dose x 1 month then stop

## 2019-05-09 ENCOUNTER — Other Ambulatory Visit: Payer: Self-pay

## 2019-05-09 ENCOUNTER — Ambulatory Visit (INDEPENDENT_AMBULATORY_CARE_PROVIDER_SITE_OTHER): Payer: BC Managed Care – PPO | Admitting: Family Medicine

## 2019-05-09 VITALS — BP 121/87 | Temp 97.6°F | Ht 62.5 in | Wt 184.0 lb

## 2019-05-09 DIAGNOSIS — E6609 Other obesity due to excess calories: Secondary | ICD-10-CM | POA: Diagnosis not present

## 2019-05-09 DIAGNOSIS — Z6833 Body mass index (BMI) 33.0-33.9, adult: Secondary | ICD-10-CM | POA: Diagnosis not present

## 2019-05-09 MED ORDER — PHENTERMINE HCL 15 MG PO CAPS: 15.0000 mg | ORAL_CAPSULE | Freq: Every morning | ORAL | 1 refills | Status: DC

## 2019-06-20 ENCOUNTER — Other Ambulatory Visit: Payer: Self-pay | Admitting: Family Medicine

## 2019-06-21 ENCOUNTER — Ambulatory Visit: Payer: BC Managed Care – PPO | Admitting: Dietician

## 2019-06-29 ENCOUNTER — Encounter: Payer: Self-pay | Admitting: Family Medicine

## 2019-07-04 LAB — HM PAP SMEAR: HM Pap smear: NEGATIVE

## 2019-07-08 ENCOUNTER — Other Ambulatory Visit: Payer: Self-pay | Admitting: Obstetrics and Gynecology

## 2019-07-08 ENCOUNTER — Other Ambulatory Visit: Payer: BC Managed Care – PPO

## 2019-07-08 ENCOUNTER — Ambulatory Visit: Payer: BC Managed Care – PPO | Attending: Internal Medicine

## 2019-07-08 DIAGNOSIS — Z20822 Contact with and (suspected) exposure to covid-19: Secondary | ICD-10-CM

## 2019-07-08 DIAGNOSIS — R928 Other abnormal and inconclusive findings on diagnostic imaging of breast: Secondary | ICD-10-CM

## 2019-07-09 LAB — NOVEL CORONAVIRUS, NAA: SARS-CoV-2, NAA: NOT DETECTED

## 2019-07-22 ENCOUNTER — Other Ambulatory Visit: Payer: Self-pay

## 2019-07-22 ENCOUNTER — Ambulatory Visit
Admission: RE | Admit: 2019-07-22 | Discharge: 2019-07-22 | Disposition: A | Discharge status: Home / Self Care | Payer: BC Managed Care – PPO | Source: Ambulatory Visit | Attending: Obstetrics and Gynecology | Admitting: Obstetrics and Gynecology

## 2019-07-22 DIAGNOSIS — R928 Other abnormal and inconclusive findings on diagnostic imaging of breast: Secondary | ICD-10-CM

## 2019-09-02 ENCOUNTER — Ambulatory Visit (INDEPENDENT_AMBULATORY_CARE_PROVIDER_SITE_OTHER): Payer: BC Managed Care – PPO | Admitting: Family Medicine

## 2019-09-02 ENCOUNTER — Other Ambulatory Visit: Payer: Self-pay

## 2019-09-02 ENCOUNTER — Encounter: Payer: Self-pay | Admitting: Family Medicine

## 2019-09-02 VITALS — BP 118/78 | HR 94 | Temp 98.0°F | Ht 62.5 in | Wt 187.0 lb

## 2019-09-02 DIAGNOSIS — D500 Iron deficiency anemia secondary to blood loss (chronic): Secondary | ICD-10-CM | POA: Diagnosis not present

## 2019-09-02 DIAGNOSIS — N924 Excessive bleeding in the premenopausal period: Secondary | ICD-10-CM | POA: Diagnosis not present

## 2019-09-02 DIAGNOSIS — Z7689 Persons encountering health services in other specified circumstances: Secondary | ICD-10-CM | POA: Diagnosis not present

## 2019-09-02 DIAGNOSIS — E6609 Other obesity due to excess calories: Secondary | ICD-10-CM

## 2019-09-02 DIAGNOSIS — Z6833 Body mass index (BMI) 33.0-33.9, adult: Secondary | ICD-10-CM

## 2019-09-02 NOTE — Progress Notes (Signed)
   Subjective:    Patient ID: Sydney [name], female    DOB: [birth year], 49 y.o.   MRN: [id number]  HPI Chief Complaint  Patient presents with  . New Patient (Initial Visit)   This is a 49 yo female who presents today to establish care. She is married. 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 Has been a very stressful year.     Last CPE-04/01/2019 Mammo- 07/22/19 Pap- gyn, Dr. Louretta Shorten, plan for vaginal hysterectomy with salpingectomy this summer Colonoscopy- negative cologuard Tdap-10/26/2010 Flu- annual Eye- regular  Dental- regular Exercise-not regular  Obesity- was on phentermine but did not see any results. Gained weight with Covid. High stress, boredom. Unable to go to gym. She and her husband have been sitting more, eating. Works nights, 4 days a week. Current eating plan- before sleeping, eggs/ salmon/ spinach, 8-9 pm at work- salad, carry out. No soda, little water, occasional sweet tea, juice.  Has had 15 pound weight gain in last year.   Fibroids- has had 2 myomectomies. Sees Dr. Corinna Capra. Having a hysterectomy this summer. Periods heavy.    Review of Systems Per HPI    Objective:   Physical Exam Vitals reviewed.  Constitutional:      General: She is not in acute distress.    Appearance: Normal appearance. She is obese. She is not ill-appearing, toxic-appearing or diaphoretic.  HENT:     Head: Normocephalic and atraumatic.  Eyes:     Conjunctiva/sclera: Conjunctivae normal.  Cardiovascular:     Rate and Rhythm: Normal rate.  Pulmonary:     Effort: Pulmonary effort is normal.  Neurological:     Mental Status: She is alert and oriented to person, place, and time.  Psychiatric:        Mood and Affect: Mood normal.        Behavior: Behavior normal.        Thought Content: Thought content normal.        Judgment: Judgment normal.       BP 118/78 (BP Location: Left Arm, Patient Position: Sitting, Cuff Size: Normal)   Pulse 94   Temp 98 F (36.7 C)  (Temporal)   Ht 5' 2.5" (1.588 m)   Wt 187 lb (84.8 kg)   SpO2 98%   BMI 33.66 kg/m  Wt Readings from Last 3 Encounters:  09/02/19 187 lb (84.8 kg)  05/08/19 184 lb (83.5 kg)  04/01/19 184 lb (83.5 kg)       Assessment & Plan:  1. Encounter to establish care - reviewed available records, health maintenance  2. Class 1 obesity due to excess calories without serious comorbidity with body mass index (BMI) of 33.0 to 33.9 in adult - discussed effects of stress, sleep, importance of consistent, healthy food choices, written and verbal information provided  3. Excessive bleeding in premenopausal period - followed by gyn, to have hysterectomy this summer  4. Iron deficiency anemia due to chronic blood loss - tolerating iron  - follow up in 6 months  This visit occurred during the SARS-CoV-2 public health emergency.  Safety protocols were in place, including screening questions prior to the visit, additional usage of staff PPE, and extensive cleaning of exam room while observing appropriate contact time as indicated for disinfecting solutions.      Clarene Reamer, FNP-BC  [facility]  09/05/2019 8:17 AM

## 2019-09-02 NOTE — Patient Instructions (Signed)
Follow up in 6 months  A resource that I like is www.dietdoctor.com/diabetes/diet  Youtube- Dr. Sharman Cheek, Dr. Gwenlyn Found  Here are some guidelines to help you with meal planning -  Avoid all processed and packaged foods (bread, pasta, crackers, chips, etc) and beverages containing calories.  Avoid added sugars and excessive natural sugars.  Attention to how you feel if you consume artificial sweeteners.  Do they make you more hungry or raise your blood sugar?  With every meal and snack, aim to get 20 g of protein (3 ounces of meat, 4 ounces of fish, 3 eggs, protein powder, 1 cup [place] yogurt, 1 cup cottage cheese, etc.)  Increase fiber in the form of non-starchy vegetables.  These help you feel full with very little carbohydrates and are good for gut health.  Eat 1 serving healthy carb per meal- 1/2 cup brown rice, beans, potato, corn- pay attention to whether or not this significantly raises your blood sugar. If it does, reduce the frequency you consume these.   Eat 2-3 servings of lower sugar fruits daily.  This includes berries, apples, oranges, peaches, pears, one half banana.  Have small amounts of good fats such as avocado, nuts, olive oil, nut butters, olives.  Add a little cheese to your salads to make them tasty.

## 2019-09-13 ENCOUNTER — Other Ambulatory Visit: Payer: Self-pay

## 2019-09-13 MED ORDER — VITAMIN D (ERGOCALCIFEROL) 1.25 MG (50000 UNIT) PO CAPS: 50000.0000 [IU] | ORAL_CAPSULE | ORAL | 0 refills | Status: DC

## 2019-09-13 NOTE — Telephone Encounter (Signed)
[id number] see refill from fax rec/prepared and pended for your approval/thx dmf

## 2019-12-01 ENCOUNTER — Other Ambulatory Visit: Payer: Self-pay | Admitting: Family Medicine

## 2019-12-13 ENCOUNTER — Telehealth: Payer: Self-pay

## 2019-12-13 NOTE — Telephone Encounter (Signed)
Patient is wanting to transfer care from [facility] to Dr. Einar Pheasant. Is this ok?

## 2019-12-13 NOTE — Telephone Encounter (Signed)
That is fine with me.

## 2019-12-16 NOTE — Telephone Encounter (Signed)
OK with me.

## 2020-02-19 ENCOUNTER — Other Ambulatory Visit: Payer: Self-pay

## 2020-02-19 ENCOUNTER — Encounter: Payer: Self-pay | Admitting: Family Medicine

## 2020-02-19 ENCOUNTER — Ambulatory Visit: Payer: BC Managed Care – PPO | Admitting: Family Medicine

## 2020-02-19 VITALS — BP 122/80 | HR 85 | Temp 97.7°F | Ht 63.0 in | Wt 185.0 lb

## 2020-02-19 DIAGNOSIS — Z6833 Body mass index (BMI) 33.0-33.9, adult: Secondary | ICD-10-CM | POA: Diagnosis not present

## 2020-02-19 DIAGNOSIS — E6609 Other obesity due to excess calories: Secondary | ICD-10-CM

## 2020-02-19 MED ORDER — NOVOFINE PLUS PEN NEEDLE 32G X 4 MM MISC: 1.0000 | Freq: Every day | 1 refills | Status: DC

## 2020-02-19 MED ORDER — SAXENDA 18 MG/3ML ~~LOC~~ SOPN: PEN_INJECTOR | SUBCUTANEOUS | 1 refills | Status: DC

## 2020-02-19 NOTE — Progress Notes (Signed)
   Subjective:     Sydney [name] is a 49 y.o. female presenting for transfer care     HPI   #abnormal uterine bleeding - s/p 2 fibroid surgery - anticipating hysterectomy - but hesitant to get elective surgery - works with [employer]  #weight gain - was more active in the gym previously - more work stress and covid which has made exercising difficulty - has tried to do calorie counting - and portion control  -   Review of Systems   Social History   Tobacco Use  Smoking Status Never Smoker  Smokeless Tobacco Never Used        Objective:    BP Readings from Last 3 Encounters:  02/19/20 122/80  09/02/19 118/78  05/08/19 121/87   Wt Readings from Last 3 Encounters:  02/19/20 185 lb (83.9 kg)  09/02/19 187 lb (84.8 kg)  05/08/19 184 lb (83.5 kg)    BP 122/80   Pulse 85   Temp 97.7 F (36.5 C) (Temporal)   Ht 5\' 3"  (1.6 m)   Wt 185 lb (83.9 kg)   LMP 02/15/2020 (Exact Date)   SpO2 98%   BMI 32.77 kg/m    Physical Exam Constitutional:      General: She is not in acute distress.    Appearance: She is well-developed. She is not diaphoretic.  HENT:     Right Ear: External ear normal.     Left Ear: External ear normal.     Nose: Nose normal.  Eyes:     Conjunctiva/sclera: Conjunctivae normal.  Cardiovascular:     Rate and Rhythm: Normal rate.  Pulmonary:     Effort: Pulmonary effort is normal.  Musculoskeletal:     Cervical back: Neck supple.  Skin:    General: Skin is warm and dry.     Capillary Refill: Capillary refill takes less than 2 seconds.  Neurological:     Mental Status: She is alert. Mental status is at baseline.  Psychiatric:        Mood and Affect: Mood normal.        Behavior: Behavior normal.           Assessment & Plan:   Problem List Items Addressed This Visit      Other   Class 1 obesity with body mass index (BMI) of 33.0 to 33.9 in adult - Primary    BMI 32 which qualifies her for treatment. Discussed that she  may need to stop after only a small amount of weight loss. Discuss side effects and risks of Saxenda. Return 3 months. Increase to highest tolerated dose. Cont exercise and healthy diet.       Relevant Medications   Liraglutide -Weight Management (SAXENDA) 18 MG/3ML SOPN   Insulin Pen Needle (NOVOFINE PLUS PEN NEEDLE) 32G X 4 MM MISC       Return in about 3 months (around 05/20/2020).  Lesleigh Noe, MD  This visit occurred during the SARS-CoV-2 public health emergency.  Safety protocols were in place, including screening questions prior to the visit, additional usage of staff PPE, and extensive cleaning of exam room while observing appropriate contact time as indicated for disinfecting solutions.

## 2020-02-19 NOTE — Assessment & Plan Note (Signed)
BMI 32 which qualifies her for treatment. Discussed that she may need to stop after only a small amount of weight loss. Discuss side effects and risks of Saxenda. Return 3 months. Increase to highest tolerated dose. Cont exercise and healthy diet.

## 2020-02-19 NOTE — Patient Instructions (Addendum)
Start the medication - continue to work on healthy eating and exercise - monitor for stomach upset and only increase dose of medication if you are feeling well  Return in 3 months or sooner if issues

## 2020-02-21 ENCOUNTER — Encounter: Payer: Self-pay | Admitting: Family Medicine

## 2020-02-21 DIAGNOSIS — E6609 Other obesity due to excess calories: Secondary | ICD-10-CM

## 2020-02-27 MED ORDER — PHENTERMINE HCL 15 MG PO CAPS: 15.0000 mg | ORAL_CAPSULE | ORAL | 0 refills | Status: DC

## 2020-02-27 NOTE — Addendum Note (Signed)
Addended by: Waunita Schooner R on: 02/27/2020 11:50 AM   Modules accepted: Orders

## 2020-03-06 ENCOUNTER — Ambulatory Visit: Payer: BC Managed Care – PPO | Admitting: Family Medicine

## 2020-04-09 ENCOUNTER — Other Ambulatory Visit: Payer: Self-pay

## 2020-04-09 ENCOUNTER — Ambulatory Visit (INDEPENDENT_AMBULATORY_CARE_PROVIDER_SITE_OTHER): Payer: BC Managed Care – PPO | Admitting: Family Medicine

## 2020-04-09 ENCOUNTER — Encounter: Payer: Self-pay | Admitting: Family Medicine

## 2020-04-09 VITALS — BP 120/80 | HR 97 | Temp 97.4°F | Ht 62.0 in | Wt 177.8 lb

## 2020-04-09 DIAGNOSIS — E559 Vitamin D deficiency, unspecified: Secondary | ICD-10-CM

## 2020-04-09 DIAGNOSIS — K5901 Slow transit constipation: Secondary | ICD-10-CM

## 2020-04-09 DIAGNOSIS — E6609 Other obesity due to excess calories: Secondary | ICD-10-CM

## 2020-04-09 DIAGNOSIS — Z6833 Body mass index (BMI) 33.0-33.9, adult: Secondary | ICD-10-CM

## 2020-04-09 DIAGNOSIS — D508 Other iron deficiency anemias: Secondary | ICD-10-CM | POA: Diagnosis not present

## 2020-04-09 DIAGNOSIS — Z0000 Encounter for general adult medical examination without abnormal findings: Secondary | ICD-10-CM

## 2020-04-09 MED ORDER — PHENTERMINE HCL 30 MG PO CAPS: 30.0000 mg | ORAL_CAPSULE | ORAL | 0 refills | Status: DC

## 2020-04-09 NOTE — Assessment & Plan Note (Addendum)
Increase phentermine dose. Return in 4 weeks. Waist 38 inches, weight 177 lbs

## 2020-04-09 NOTE — Progress Notes (Signed)
Annual Exam   Chief Complaint:  Chief Complaint  Patient presents with  . Annual Exam    no concerns    History of Present Illness:  Ms. Sydney [name] is a 49 y.o. No obstetric history on file. who LMP was Patient's last menstrual period was 03/30/2020 (exact date)., presents today for her annual examination.    Fibroids - on birth control for bleeding w/o improvement - myomectomy - option of hysterectomy  Nutrition She does get adequate calcium and Vitamin D in her diet. Diet: trying to do better with meals, phentermine initially helped, trying to do meal prepping Exercise: not currently  Safety The patient wears seatbelts: yes.     The patient feels safe at home and in their relationships: yes.   Menstrual:  Symptoms of menopause: is starting to have some irregular cycles  Heavy cycles Follows with GYN   GYN She is single partner, contraception - not able to become pregnant.    Cervical Cancer Screening (21-65):   Last Pap:   January 2019 Results were: no abnormalities /neg HPV DNA   Breast Cancer Screening (Age 40-74):  There is no FH of breast cancer. There is no FH of ovarian cancer. BRCA screening Not Indicated.  Last Mammogram: 07/22/2019 The patient does want a mammogram this year.    Colon Cancer Screening:  Age 78-75 yo - benefits outweigh the risk. Adults 76-85 yo who have never been screened benefit.  Benefits: 134000 people in 2016 will be diagnosed and 49,000 will die - early detection helps [place]: Complications 2/2 to colonoscopy High Risk (Colonoscopy): genetic disorder (Lynch syndrome or familial adenomatous polyposis), personal hx of IBD, previous adenomatous polyp, or previous colorectal cancer, FamHx start 10 years before the age at diagnosis, increased in males and black race  Options:  FIT - looks for hemoglobin (blood in the stool) - specific and fairly sensitive - must be done annually Cologuard - looks for DNA and blood - more  sensitive - therefore can have more false positives, every 3 years Colonoscopy - every 10 years if normal - sedation, bowl prep, must have someone drive you  Shared decision making and the patient had decided to do -- cologuard in 2020 up to date.   Social History   Tobacco Use  Smoking Status Never Smoker  Smokeless Tobacco Never Used    Weight Wt Readings from Last 3 Encounters:  04/09/20 177 lb 12 oz (80.6 kg)  02/19/20 185 lb (83.9 kg)  09/02/19 187 lb (84.8 kg)   Patient has high BMI  BMI Readings from Last 1 Encounters:  04/09/20 32.51 kg/m     Chronic disease screening Blood pressure monitoring:  BP Readings from Last 3 Encounters:  04/09/20 120/80  02/19/20 122/80  09/02/19 118/78    Lipid Monitoring: Indication for screening: age >89, obesity, diabetes, family hx, CV risk factors.  Lipid screening: Yes  Lab Results  Component Value Date   CHOL 179 04/01/2019   HDL 77.50 04/01/2019   LDLCALC 84 04/01/2019   TRIG 90.0 04/01/2019   CHOLHDL 2 04/01/2019     Diabetes Screening: age >53, overweight, family hx, PCOS, hx of gestational diabetes, at risk ethnicity Diabetes Screening screening: Not Indicated  Lab Results  Component Value Date   HGBA1C 5.5 04/01/2019     Past Medical History:  Diagnosis Date  . Abnormal bleeding in menstrual cycle   . Fibroids   . Headache(784.0)    otc med prn  . Hemorrhoids   .  IBS (irritable bowel syndrome)   . Raised intraocular pressure of both eyes    followed yearly by Dr. Katy Fitch    Past Surgical History:  Procedure Laterality Date  . COLONOSCOPY    . HYSTEROSCOPY WITH D & C  10/14/2011   Procedure: DILATATION AND CURETTAGE /HYSTEROSCOPY;  Surgeon: Luz Lex, MD;  Location: [facility];  Service: Gynecology;  Laterality: N/A;  WITH TRUCLEAR morcellation  . INDUCED ABORTION    . MYOMECTOMY  2013  . UPPER GASTROINTESTINAL ENDOSCOPY    . UTERINE FIBROID SURGERY  08/13/2015   Dr. Corinna Capra  . WISDOM TOOTH  EXTRACTION      Prior to Admission medications   Medication Sig Start Date End Date Taking? Authorizing Provider  B Complex-C (B-COMPLEX WITH VITAMIN C) tablet Take 1 tablet by mouth daily.   Yes [provider]  cetirizine (ZYRTEC) 10 MG tablet Take 10 mg by mouth daily.   Yes [provider]  ibuprofen (ADVIL,MOTRIN) 800 MG tablet Take 800 mg by mouth every 4 (four) hours as needed. For cramps-- pt says she does take every 4-6 hours if needed for major cramping   Yes [provider]  phentermine 15 MG capsule Take 1 capsule (15 mg total) by mouth every morning. 02/27/20  Yes Lesleigh Noe, MD  pseudoephedrine (SUDAFED) 30 MG tablet Take 60 mg by mouth daily as needed. As needed for sinus migraine.  Take with excedrin   Yes [provider]  tranexamic acid (LYSTEDA) 650 MG TABS tablet Take 1,300 mg by mouth 3 (three) times daily as needed. 07/31/19  Yes [provider]  Vitamin D, Ergocalciferol, (DRISDOL) 1.25 MG (50000 UNIT) CAPS capsule TAKE 1 CAPSULE (50,000 UNITS TOTAL) BY MOUTH EVERY 7 (SEVEN) DAYS. 12/03/19  Yes Elby Beck, FNP    No Known Allergies  Gynecologic History: Patient's last menstrual period was 03/30/2020 (exact date).  Obstetric History: No obstetric history on file.  Social History   Socioeconomic History  . Marital status: Married    Spouse name: Cornelia Copa  . Number of children: 0  . Years of education: Associates in Respiratory Therapy  . Highest education level: Not on file  Occupational History  . Not on file  Tobacco Use  . Smoking status: Never Smoker  . Smokeless tobacco: Never Used  Substance and Sexual Activity  . Alcohol use: Yes    Comment: wine - once a week  . Drug use: No  . Sexual activity: Yes    Birth control/protection: None  Other Topics Concern  . 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 Difficulty of Paying Living Expenses: Not on file  Food Insecurity:   . Worried About Charity fundraiser in the Last Year: Not on file  . Ran Out of Food in the Last Year: Not on file  Transportation Needs:   .  Lack of Transportation (Medical): Not on file  . Lack of Transportation (Non-Medical): Not on file  Physical Activity:   . Days of Exercise per Week: Not on file  . Minutes of Exercise per Session: Not on file  Stress:   . Feeling of Stress : Not on file  Social Connections:   . Frequency of Communication with Friends and Family: Not on file  . Frequency of Social Gatherings with Friends and Family: Not on file  . Attends Religious Services: Not on file  . Active Member of Clubs or Organizations: Not on file  . Attends Archivist Meetings: Not on file  . Marital Status: Not on file  Intimate Partner Violence:   . Fear of Current or Ex-Partner: Not on file  . Emotionally Abused: Not on file  . Physically Abused: Not on file  . Sexually Abused: Not on file    Family History  Problem Relation Age of Onset  . Heart disease Maternal Grandmother   . Hypertension Paternal Grandmother   . Hypertension Paternal Grandfather   . Diabetes Mother   . Hypertension Mother     Review of Systems  Constitutional: Negative for chills and fever.  HENT: Negative for congestion and sore throat.   Eyes: Negative for blurred vision and double vision.  Respiratory: Negative for shortness of breath.   Cardiovascular: Negative for chest pain.  Gastrointestinal: Negative for heartburn, nausea and vomiting.  Genitourinary:  Negative.   Musculoskeletal: Negative.  Negative for myalgias.  Skin: Negative for rash.  Neurological: Negative for dizziness and headaches.  Endo/Heme/Allergies: Does not bruise/bleed easily.  Psychiatric/Behavioral: Negative for depression. The patient is not nervous/anxious.      Physical Exam BP 120/80   Pulse 97   Temp (!) 97.4 F (36.3 C) (Temporal)   Ht _0  (1.575 m)   Wt 177 lb 12 oz (80.6 kg)   LMP 03/30/2020 (Exact Date)   SpO2 97%   BMI 32.51 kg/m    BP Readings from Last 3 Encounters:  04/09/20 120/80  02/19/20 122/80  09/02/19 118/78      Physical Exam Constitutional:      General: She is not in acute distress.    Appearance: She is well-developed. She is obese. She is not diaphoretic.     Comments: Waist 38 inches  HENT:     Head: Normocephalic and atraumatic.     Right Ear: External ear normal.     Left Ear: External ear normal.     Nose: Nose normal.  Eyes:     General: No scleral icterus.    Conjunctiva/sclera: Conjunctivae normal.  Cardiovascular:     Rate and Rhythm: Normal rate and regular rhythm.     Heart sounds: No murmur heard.   Pulmonary:     Effort: Pulmonary effort is normal. No respiratory distress.     Breath sounds: Normal breath sounds. No wheezing.  Abdominal:     General: Bowel sounds are normal. There is no distension.     Palpations: Abdomen is soft. There is no mass.     Tenderness: There is no abdominal tenderness. There is no guarding or rebound.  Musculoskeletal:        General: Normal range of motion.     Cervical back: Neck supple.  Lymphadenopathy:     Cervical: No cervical adenopathy.  Skin:    General: Skin is warm and dry.     Capillary Refill: Capillary refill takes less than 2 seconds.  Neurological:  Mental Status: She is alert and oriented to person, place, and time.     Deep Tendon Reflexes: Reflexes normal.  Psychiatric:        Behavior: Behavior normal.     Results:  PHQ-9:    Office  Visit from 04/01/2019 in [facility]  PHQ-9 Total Score 0        Assessment: 49 y.o. No obstetric history on file. female here for routine annual physical examination.  Plan: Problem List Items Addressed This Visit      Digestive   SLOW TRANSIT CONSTIPATION   Relevant Orders   Ferritin   Comprehensive metabolic panel     Other   Vitamin D deficiency - Primary   Relevant Orders   Vitamin D, 25-hydroxy   Anemia   Relevant Orders   Ferritin   CBC with Differential   Class 1 obesity with body mass index (BMI) of 33.0 to 33.9 in adult    Increase phentermine dose. Return in 4 weeks. Waist 38 inches, weight 177 lbs      Relevant Medications   phentermine 30 MG capsule    Other Visit Diagnoses    Annual physical exam       Relevant Orders   Lipid panel   TSH   Vitamin B12      Screening: -- Blood pressure screen normal -- cholesterol screening: will obtain -- Weight screening: - phentermine -- Diabetes Screening: will obtain -- Nutrition: Encouraged healthy diet  The 10-year ASCVD risk score Mikey Bussing DC Jr., et al., 2013) is: 1%   Values used to calculate the score:     Age: 39 years     Sex: Female     Is Non-Hispanic African American: Yes     Diabetic: No     Tobacco smoker: No     Systolic Blood Pressure: 956 mmHg     Is BP treated: No     HDL Cholesterol: 77.5 mg/dL     Total Cholesterol: 179 mg/dL  -- Statin therapy for Age 58-75 with CVD risk >7.5%  Psych -- Depression screening (PHQ-9):    Office Visit from 04/01/2019 in [facility]  PHQ-9 Total Score 0       Safety -- tobacco screening: not using -- alcohol screening:  low-risk usage. -- no evidence of domestic violence or intimate partner violence.   Cancer Screening -- pap smear not collected per ASCCP guidelines -- family history of breast cancer screening: done. not at high risk. -- Mammogram - due in February -- Colon cancer (age 62+)-- up to  date  Immunizations Immunization History  Administered Date(s) Administered  . Influenza,inj,Quad PF,6+ Mos 03/22/2018  . Influenza-Unspecified 03/22/2018, 03/11/2019  . PFIZER SARS-COV-2 Vaccination 05/20/2019, 06/13/2019  . Td 10/26/2010  . Zoster Recombinat (Shingrix) 04/20/2019    -- flu vaccine up to date -- TDAP q10 years up to date -- Shingles (age >87) up to date -- Covid-19 Vaccine up to date   Encouraged healthy diet and exercise. Encouraged regular vision and dental care.    Lesleigh Noe, MD

## 2020-04-09 NOTE — Patient Instructions (Addendum)
Return in 4 weeks for weight check    Constipation   Constipation is a common issue. Often it is related to diet and occasionally medications.   What you can do to treat your symptoms 1) Fiber -- Eat more fiber rich foods: beans, broccoli, berries, avocados, popcorn, pear/apple, green peas, turnip greens, brussels sprouts, whole grains (barley, bran, quinoa, oatmeal) -- Take a Fiber supplement: Psyllium (Metamucil)  -- Could also eat Prunes daily  2) Hydration  -- Drink more water: Try to drink 64 oz of water per day  3) Exercise -- Moderate exercise (walking, jogging, biking) for 30 minutes, 5 days a week  4) Dedicate time for Bowel movements - do not delay  5) Stool Softener  - Docusate Sodium (Colace) 100 mg daily or twice daily as needed   If 4-6 weeks have passed and the above has not helped then start the following 6) Laxatives -- Polyethylene Glycol (Miralax) - begin with once daily. After a few days can increase to twice daily Or -- Magnesium Citrate -- Common side effect is nausea and diarrhea -- can try if still not improved   Treating chronic constipation is often about finding the right amount of medication and fiber to keep you regular and comfortable. For some people that may be daily metamucil and colace every other day. For others it may be Metamucil and colace twice daily and Miralax 3 times a week. The goal is to go slow and listen to your body. And normal can be anywhere from 2-3 soft bowel movements a day to 1 bowel movement every 2-3 days.

## 2020-04-10 LAB — COMPREHENSIVE METABOLIC PANEL
ALT: 8 U/L (ref 0–35)
AST: 15 U/L (ref 0–37)
Albumin: 4.2 g/dL (ref 3.5–5.2)
Alkaline Phosphatase: 87 U/L (ref 39–117)
BUN: 10 mg/dL (ref 6–23)
CO2: 28 mEq/L (ref 19–32)
Calcium: 9.5 mg/dL (ref 8.4–10.5)
Chloride: 102 mEq/L (ref 96–112)
Creatinine, Ser: 1.02 mg/dL (ref 0.40–1.20)
GFR: 63.73 mL/min (ref >60.00)
Glucose, Bld: 84 mg/dL (ref 70–99)
Potassium: 4 mEq/L (ref 3.5–5.1)
Sodium: 138 mEq/L (ref 135–145)
Total Bilirubin: 0.4 mg/dL (ref 0.2–1.2)
Total Protein: 7.2 g/dL (ref 6.0–8.3)

## 2020-04-10 LAB — CBC WITH DIFFERENTIAL/PLATELET
Basophils Absolute: 0.1 10*3/uL (ref 0.0–0.1)
Basophils Relative: 0.9 % (ref 0.0–3.0)
Eosinophils Absolute: 0 10*3/uL (ref 0.0–0.7)
Eosinophils Relative: 0.6 % (ref 0.0–5.0)
HCT: 33.1 % — ABNORMAL LOW (ref 36.0–46.0)
Hemoglobin: 10.6 g/dL — ABNORMAL LOW (ref 12.0–15.0)
Lymphocytes Relative: 33.1 % (ref 12.0–46.0)
Lymphs Abs: 2 10*3/uL (ref 0.7–4.0)
MCHC: 32 g/dL (ref 30.0–36.0)
MCV: 79.9 fl (ref 78.0–100.0)
Monocytes Absolute: 0.3 10*3/uL (ref 0.1–1.0)
Monocytes Relative: 5.5 % (ref 3.0–12.0)
Neutro Abs: 3.6 10*3/uL (ref 1.4–7.7)
Neutrophils Relative %: 59.9 % (ref 43.0–77.0)
Platelets: 326 10*3/uL (ref 150.0–400.0)
RBC: 4.15 Mil/uL (ref 3.87–5.11)
RDW: 17.4 % — ABNORMAL HIGH (ref 11.5–15.5)
WBC: 6.1 10*3/uL (ref 4.0–10.5)

## 2020-04-10 LAB — LIPID PANEL
Cholesterol: 180 mg/dL (ref 0–200)
HDL: 88.2 mg/dL (ref >39.00)
LDL Cholesterol: 80 mg/dL (ref 0–99)
NonHDL: 91.46
Total CHOL/HDL Ratio: 2
Triglycerides: 55 mg/dL (ref 0.0–149.0)
VLDL: 11 mg/dL (ref 0.0–40.0)

## 2020-04-10 LAB — FERRITIN: Ferritin: 8.7 ng/mL — ABNORMAL LOW (ref 10.0–291.0)

## 2020-04-10 LAB — VITAMIN B12: Vitamin B-12: 352 pg/mL (ref 211–911)

## 2020-04-10 LAB — TSH: TSH: 1.61 u[IU]/mL (ref 0.35–4.50)

## 2020-04-10 LAB — VITAMIN D 25 HYDROXY (VIT D DEFICIENCY, FRACTURES): VITD: 44.02 ng/mL (ref 30.00–100.00)

## 2020-04-14 ENCOUNTER — Encounter: Payer: Self-pay | Admitting: Family Medicine

## 2020-05-08 ENCOUNTER — Other Ambulatory Visit: Payer: Self-pay | Admitting: Family Medicine

## 2020-06-11 ENCOUNTER — Ambulatory Visit: Payer: BC Managed Care – PPO | Admitting: Family Medicine

## 2020-06-11 ENCOUNTER — Encounter: Payer: BC Managed Care – PPO | Admitting: Family Medicine

## 2020-12-15 ENCOUNTER — Encounter: Payer: Self-pay | Admitting: Family Medicine

## 2020-12-15 ENCOUNTER — Ambulatory Visit: Payer: BC Managed Care – PPO | Admitting: Family Medicine

## 2020-12-15 ENCOUNTER — Other Ambulatory Visit: Payer: Self-pay

## 2020-12-15 VITALS — BP 110/62 | HR 91 | Temp 98.4°F | Ht 62.0 in | Wt 192.0 lb

## 2020-12-15 DIAGNOSIS — W57XXXA Bitten or stung by nonvenomous insect and other nonvenomous arthropods, initial encounter: Secondary | ICD-10-CM

## 2020-12-15 DIAGNOSIS — L309 Dermatitis, unspecified: Secondary | ICD-10-CM | POA: Diagnosis not present

## 2020-12-15 DIAGNOSIS — S80861A Insect bite (nonvenomous), right lower leg, initial encounter: Secondary | ICD-10-CM | POA: Diagnosis not present

## 2020-12-15 MED ORDER — CEPHALEXIN 500 MG PO CAPS: 500.0000 mg | ORAL_CAPSULE | Freq: Two times a day (BID) | ORAL | 0 refills | Status: AC

## 2020-12-15 MED ORDER — TRIAMCINOLONE ACETONIDE 0.1 % EX CREA: 1.0000 "application " | TOPICAL_CREAM | Freq: Two times a day (BID) | CUTANEOUS | 0 refills | Status: DC

## 2020-12-15 NOTE — Progress Notes (Signed)
Subjective:     Sydney [name] is a 50 y.o. female presenting for Insect Bite     HPI  #Left wrist 7/5 - swelling - itching/burning - used cold packs - thought it might have been a spider bite  #bite? 7/15 - on the right leg - blistered and got worse - now the blister has ruptured - itchy - painful - does not know if she got bit - just itching initially [place], redness, pain - still with redness and swelling   #Back rash - itchy - scratching - is using acne medication with this to see if it will help - husband put cortisone 10 w/ some improvement   Review of Systems   Social History   Tobacco Use  Smoking Status Never  Smokeless Tobacco Never        Objective:    BP Readings from Last 3 Encounters:  12/15/20 110/62  04/09/20 120/80  02/19/20 122/80   Wt Readings from Last 3 Encounters:  12/15/20 192 lb (87.1 kg)  04/09/20 177 lb 12 oz (80.6 kg)  02/19/20 185 lb (83.9 kg)    BP 110/62   Pulse 91   Temp 98.4 F (36.9 C) (Temporal)   Ht 5\' 2"  (1.575 m)   Wt 192 lb (87.1 kg)   SpO2 97%   BMI 35.12 kg/m    Physical Exam Constitutional:      General: She is not in acute distress.    Appearance: She is well-developed. She is not diaphoretic.  HENT:     Right Ear: External ear normal.     Left Ear: External ear normal.     Nose: Nose normal.  Eyes:     Conjunctiva/sclera: Conjunctivae normal.  Cardiovascular:     Rate and Rhythm: Normal rate.  Pulmonary:     Effort: Pulmonary effort is normal.  Musculoskeletal:     Cervical back: Neck supple.  Skin:    General: Skin is warm and dry.     Capillary Refill: Capillary refill takes less than 2 seconds.     Comments: Back: papular rash covering the extent of the back. No erythema.  Right posterior calf: ulcerative lesion with surround faint erythema  Neurological:     Mental Status: She is alert. Mental status is at baseline.   Psychiatric:        Mood and Affect: Mood normal.        Behavior: Behavior normal.          Assessment & Plan:   Problem List Items Addressed This Visit   None Visit Diagnoses     Bug bite with infection, initial encounter    -  Primary   Relevant Medications   cephALEXin (KEFLEX) 500 MG capsule   Dermatitis       Relevant Medications   triamcinolone cream (KENALOG) 0.1 %      Suspect back rash is a dermatitis of some kinds - treat with triamcinolone - dermatology if no improvement  Bug bite - suspect possible infection vs strong immune reaction will treat with abx given erythema    Return if symptoms worsen or fail to improve.  Lesleigh Noe, MD  This visit occurred during the SARS-CoV-2 public health emergency.  Safety protocols were in place, including screening questions prior to the visit, additional usage of staff PPE, and extensive cleaning of exam room while observing appropriate contact time as indicated for disinfecting solutions.

## 2020-12-15 NOTE — Patient Instructions (Signed)
#  Back Rash - use Triamcinolone twice daily - may take up to 3 weeks to go away - it should appear to get better within 1 week - call if no improvement  #Bite - possible infection - start oral antibiotics - call if worsening or not improving

## 2020-12-24 IMAGING — MG MM DIGITAL DIAGNOSTIC UNILAT*L* W/ TOMO W/ CAD
4 series · 4 of 12 positions shown · non-contrast
Comparison: Previous exam(s).

CLINICAL DATA: 48-year-old female for further evaluation of
possible LEFT breast mass on screening mammogram.

EXAM:
DIGITAL DIAGNOSTIC LEFT MAMMOGRAM WITH TOMO
ULTRASOUND LEFT BREAST

[L MLO synth-2D]
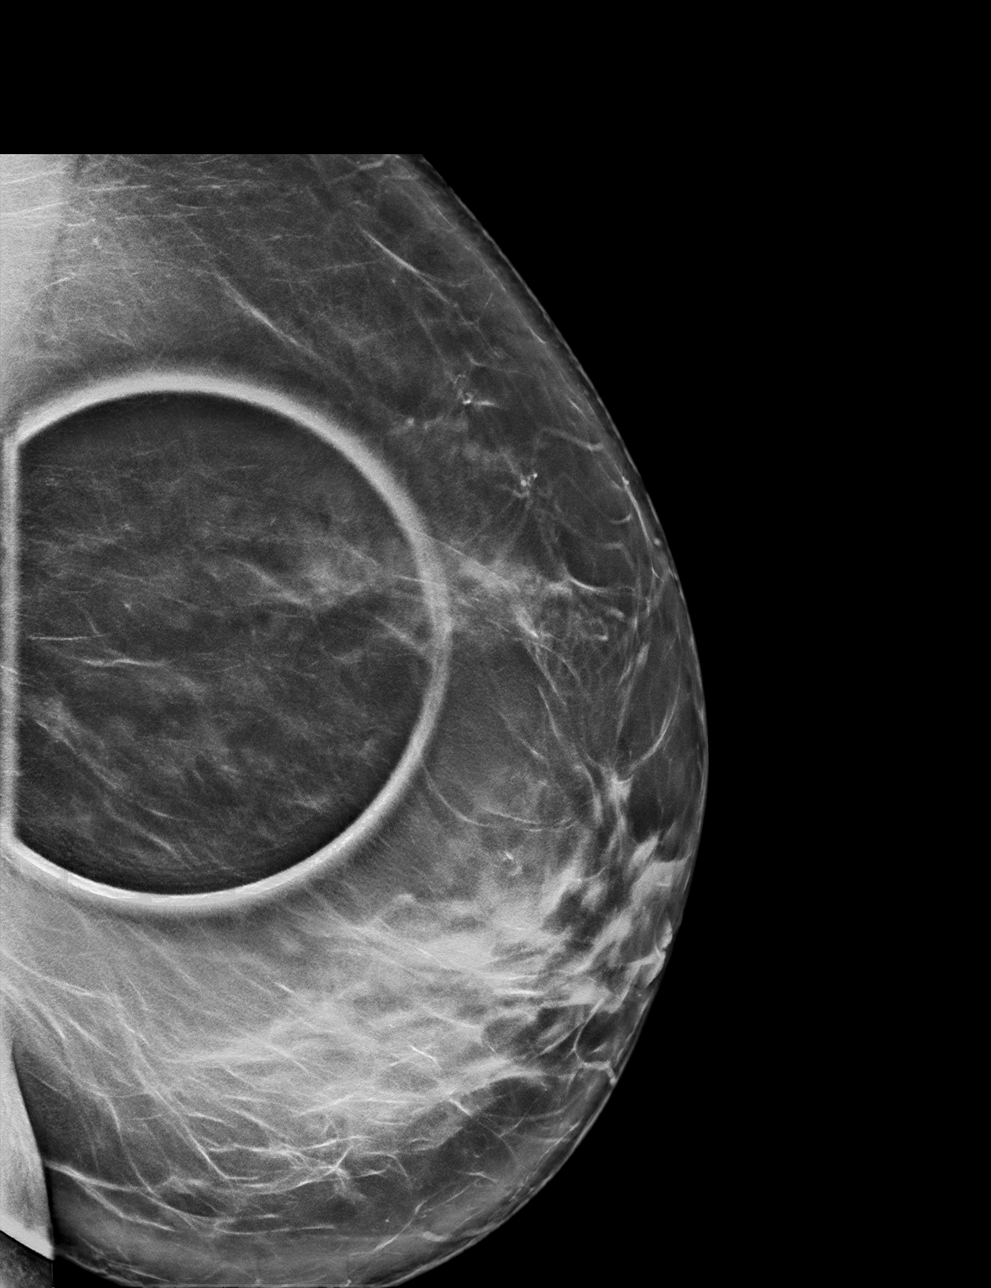

[L CC synth-2D]
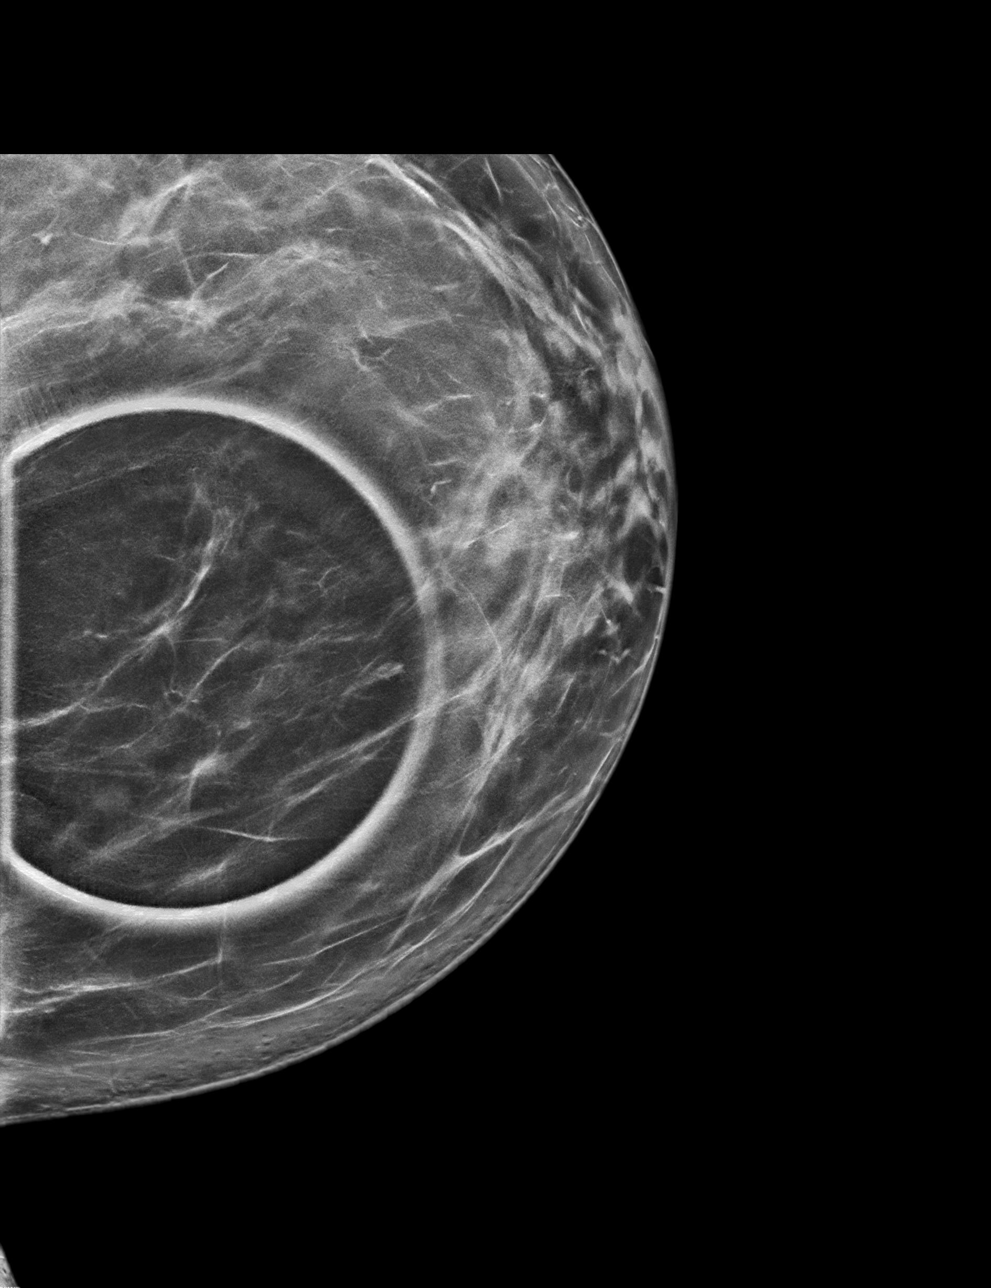

[L MLO tomo · tomo slice 47/94.0]
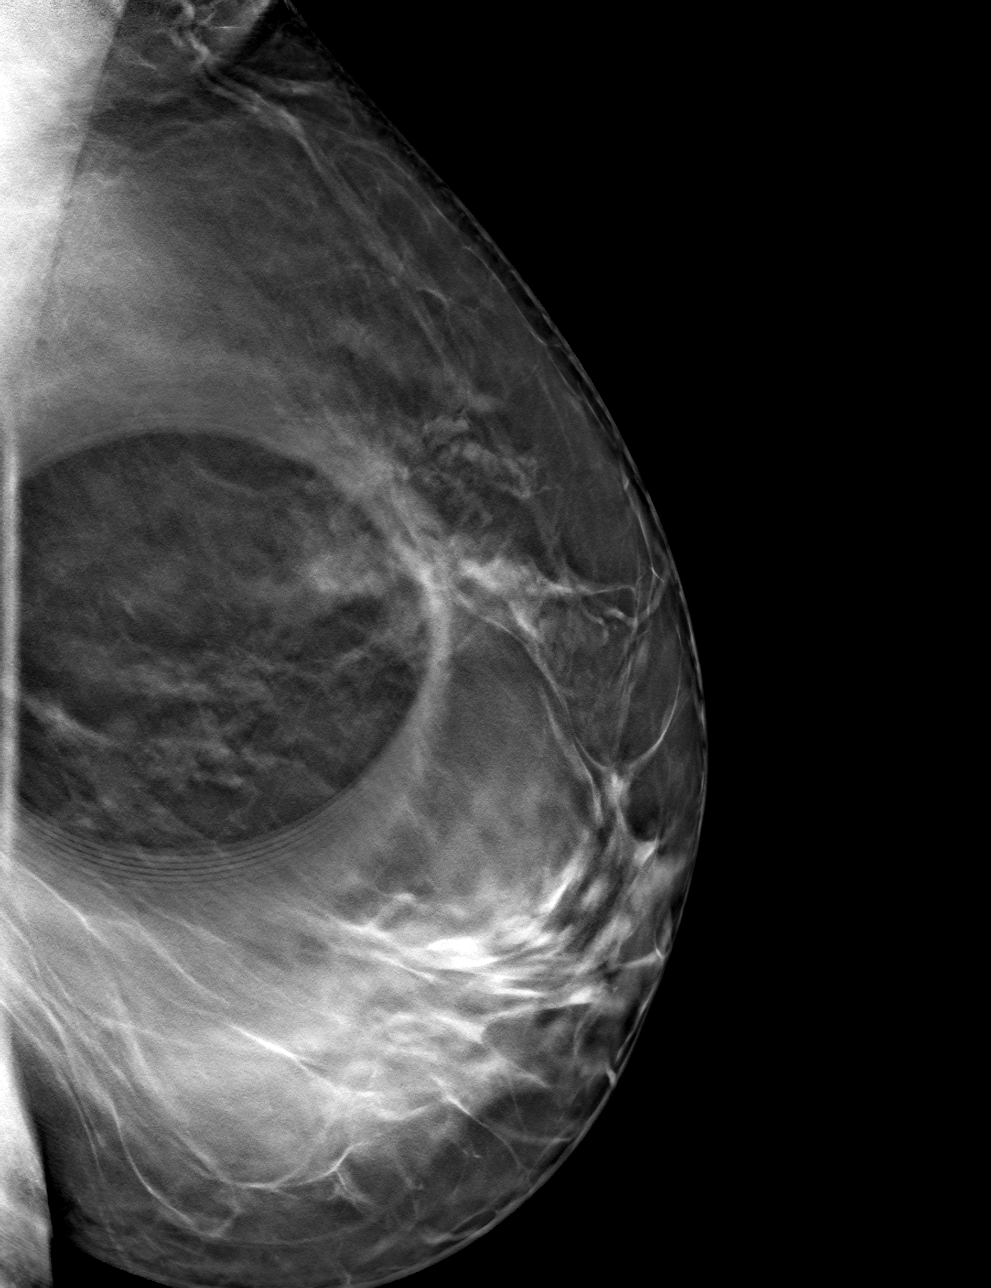

[L CC tomo · tomo slice 44/87.0]
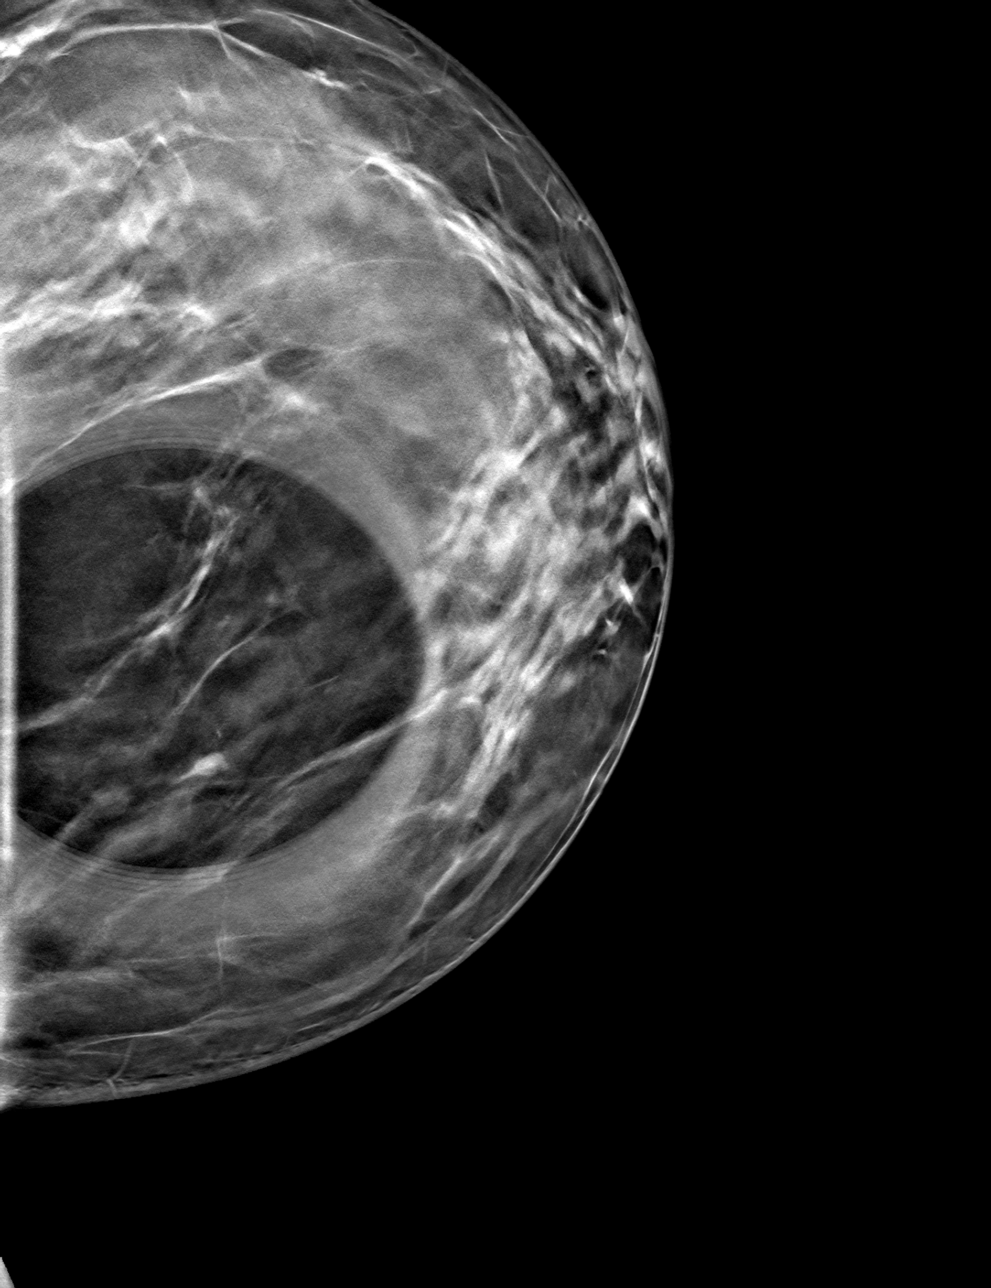

[4 of 12 positions shown; findings below may reference images not displayed]

ACR Breast Density Category c: The breast tissue is heterogeneously
dense, which may obscure small masses.
FINDINGS: 2D/3D spot compression views of the LEFT breast demonstrate a
persistent 0.6 cm circumscribed oval mass within the UPPER INNER
LEFT breast.

Targeted ultrasound is performed, showing a 0.6 x 0.3 x 0.5 cm
benign complicated cyst at the 10 o'clock position of the LEFT
breast 6 cm from the nipple, corresponding to the screening study
finding.
IMPRESSION: Benign UPPER INNER LEFT breast cyst corresponding to the screening
study finding.

RECOMMENDATION:
Bilateral screening mammogram in 1 year.

I have discussed the findings and recommendations with the patient.
If applicable, a reminder letter will be sent to the patient
regarding the next appointment.

BI-RADS CATEGORY  2: Benign.

## 2020-12-26 ENCOUNTER — Encounter: Payer: Self-pay | Admitting: Family Medicine

## 2020-12-26 DIAGNOSIS — L309 Dermatitis, unspecified: Secondary | ICD-10-CM

## 2021-03-24 ENCOUNTER — Encounter: Payer: Self-pay | Admitting: Family Medicine

## 2021-08-26 ENCOUNTER — Encounter: Payer: BC Managed Care – PPO | Admitting: Family Medicine

## 2021-09-02 LAB — HM MAMMOGRAPHY

## 2021-10-19 ENCOUNTER — Encounter: Payer: Self-pay | Admitting: Family Medicine

## 2021-10-19 ENCOUNTER — Ambulatory Visit (INDEPENDENT_AMBULATORY_CARE_PROVIDER_SITE_OTHER): Payer: BC Managed Care – PPO | Admitting: Family Medicine

## 2021-10-19 VITALS — BP 124/88 | HR 90 | Temp 97.6°F | Ht 62.0 in | Wt 189.0 lb

## 2021-10-19 DIAGNOSIS — Z23 Encounter for immunization: Secondary | ICD-10-CM | POA: Diagnosis not present

## 2021-10-19 DIAGNOSIS — D500 Iron deficiency anemia secondary to blood loss (chronic): Secondary | ICD-10-CM

## 2021-10-19 DIAGNOSIS — Z6833 Body mass index (BMI) 33.0-33.9, adult: Secondary | ICD-10-CM

## 2021-10-19 DIAGNOSIS — E6609 Other obesity due to excess calories: Secondary | ICD-10-CM

## 2021-10-19 DIAGNOSIS — Z0000 Encounter for general adult medical examination without abnormal findings: Secondary | ICD-10-CM

## 2021-10-19 DIAGNOSIS — Z1211 Encounter for screening for malignant neoplasm of colon: Secondary | ICD-10-CM | POA: Diagnosis not present

## 2021-10-19 LAB — COMPREHENSIVE METABOLIC PANEL
ALT: 7 U/L (ref 0–35)
AST: 13 U/L (ref 0–37)
Albumin: 4 g/dL (ref 3.5–5.2)
Alkaline Phosphatase: 80 U/L (ref 39–117)
BUN: 14 mg/dL (ref 6–23)
CO2: 26 mEq/L (ref 19–32)
Calcium: 9.1 mg/dL (ref 8.4–10.5)
Chloride: 104 mEq/L (ref 96–112)
Creatinine, Ser: 0.8 mg/dL (ref 0.40–1.20)
GFR: 84.39 mL/min (ref >60.00)
Glucose, Bld: 84 mg/dL (ref 70–99)
Potassium: 3.9 mEq/L (ref 3.5–5.1)
Sodium: 136 mEq/L (ref 135–145)
Total Bilirubin: 0.2 mg/dL (ref 0.2–1.2)
Total Protein: 6.7 g/dL (ref 6.0–8.3)

## 2021-10-19 LAB — LIPID PANEL
Cholesterol: 162 mg/dL (ref 0–200)
HDL: 73.3 mg/dL (ref >39.00)
LDL Cholesterol: 73 mg/dL (ref 0–99)
NonHDL: 88.32
Total CHOL/HDL Ratio: 2
Triglycerides: 78 mg/dL (ref 0.0–149.0)
VLDL: 15.6 mg/dL (ref 0.0–40.0)

## 2021-10-19 LAB — CBC
HCT: 32.1 % — ABNORMAL LOW (ref 36.0–46.0)
Hemoglobin: 10.2 g/dL — ABNORMAL LOW (ref 12.0–15.0)
MCHC: 31.9 g/dL (ref 30.0–36.0)
MCV: 78.9 fl (ref 78.0–100.0)
Platelets: 290 10*3/uL (ref 150.0–400.0)
RBC: 4.07 Mil/uL (ref 3.87–5.11)
RDW: 16.5 % — ABNORMAL HIGH (ref 11.5–15.5)
WBC: 5.9 10*3/uL (ref 4.0–10.5)

## 2021-10-19 LAB — TSH: TSH: 2.6 u[IU]/mL (ref 0.35–5.50)

## 2021-10-19 LAB — FERRITIN: Ferritin: 8.6 ng/mL — ABNORMAL LOW (ref 10.0–291.0)

## 2021-10-19 NOTE — Assessment & Plan Note (Addendum)
2/2 to fibroid bleeding. Will get iron and blood counts. If persisting she has failed oral iron. Will plan for IV iron infusion

## 2021-10-19 NOTE — Patient Instructions (Addendum)
#  Referral I have placed a referral to a specialist for you. You should receive a phone call from the specialty office. Make sure your voicemail is not full and that if you are able to answer your phone to unknown or new numbers.   It may take up to 2 weeks to hear about the referral. If you do not hear anything in 2 weeks, please call our office and ask to speak with the referral coordinator.   To [facility] today - we can plan for iron infusions if iron and blood counts still low

## 2021-10-19 NOTE — Progress Notes (Signed)
Annual Exam   Chief Complaint:  Chief Complaint  Patient presents with   Annual Exam    No concerns     History of Present Illness:  Ms. Sydney [name] is a 51 y.o. No obstetric history on file. who LMP was Patient's last menstrual period was 10/16/2021., presents today for her annual examination.     Nutrition She does get adequate calcium and Vitamin D in her diet. 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     The patient feels safe at home and in their relationships: yes.   Menstrual:  Symptoms of menopause: having hot flashes, follows with GYN Continues to have heavy and irregular    GYN She is single partner, contraception - none.    Cervical Cancer Screening (21-65):   Last Pap: 4/ 2023  Breast Cancer Screening (Age 61-74):  There is no FH of breast cancer. There is no FH of ovarian cancer. BRCA screening Not Indicated.  Last Mammogram: 2023 The patient does want a mammogram this year.    Colon Cancer Screening:  Age 60-75 yo - benefits outweigh the risk. Adults 54-85 yo who have never been screened benefit.  Benefits: 134000 people in 2016 will be diagnosed and 49,000 will die - early detection helps [place]: Complications 2/2 to colonoscopy High Risk (Colonoscopy): genetic disorder (Lynch syndrome or familial adenomatous polyposis), personal hx of IBD, previous adenomatous polyp, or previous colorectal cancer, FamHx start 10 years before the age at diagnosis, increased in males and black race  Options:  FIT - looks for hemoglobin (blood in the stool) - specific and fairly  sensitive - must be done annually Cologuard - looks for DNA and blood - more sensitive - therefore can have more false positives, every 3 years Colonoscopy - every 10 years if normal - sedation, bowl prep, must have someone drive you  Shared decision making and the patient had decided to do colonoscopy.   Social History   Tobacco Use  Smoking Status Never  Smokeless Tobacco Never    Lung Cancer Screening (Ages 97-02): not applicable   Weight Wt Readings from Last 3 Encounters:  10/19/21 189 lb (85.7 kg)  12/15/20 192 lb (87.1 kg)  04/09/20 177 lb 12 oz (80.6 kg)   Patient has high BMI  BMI Readings from Last 1 Encounters:  10/19/21 34.57 kg/m     Chronic disease screening Blood pressure monitoring:  BP Readings from Last 3 Encounters:  10/19/21 124/88  12/15/20 110/62  04/09/20 120/80    Lipid Monitoring: Indication for screening: age >17, obesity, diabetes, family hx, CV risk factors.  Lipid screening: Yes  Lab Results  Component Value Date   CHOL 180 04/09/2020   HDL 88.20 04/09/2020   LDLCALC 80 04/09/2020   TRIG 55.0 04/09/2020   CHOLHDL 2 04/09/2020     Diabetes Screening: age >70, overweight, family hx, PCOS, hx of gestational diabetes, at risk ethnicity Diabetes Screening screening: Yes  Lab Results  Component Value Date   HGBA1C 5.5 04/01/2019     Past  Medical History:  Diagnosis Date   Abnormal bleeding in menstrual cycle    Fibroids    Headache(784.0)    otc med prn   Hemorrhoids    IBS (irritable bowel syndrome)    Raised intraocular pressure of both eyes    followed yearly by Dr. Katy Fitch    Past Surgical History:  Procedure Laterality Date   COLONOSCOPY     HYSTEROSCOPY WITH D & C  10/14/2011   Procedure: DILATATION AND CURETTAGE /HYSTEROSCOPY;  Surgeon: Luz Lex, MD;  Location: [facility];  Service: Gynecology;  Laterality: N/A;  WITH TRUCLEAR morcellation   INDUCED ABORTION     MYOMECTOMY  2013   NM RENAL LASIX ([facility] HX)  Bilateral    UPPER GASTROINTESTINAL ENDOSCOPY     UTERINE FIBROID SURGERY  08/13/2015   Dr. Corinna Capra   WISDOM TOOTH EXTRACTION      Prior to Admission medications   Medication Sig Start Date End Date Taking? Authorizing Provider  B Complex-C (B-COMPLEX WITH VITAMIN C) tablet Take 1 tablet by mouth daily.   Yes [provider]  ibuprofen (ADVIL,MOTRIN) 800 MG tablet Take 800 mg by mouth every 4 (four) hours as needed. For cramps-- pt says she does take every 4-6 hours if needed for major cramping   Yes [provider]  medroxyPROGESTERone (PROVERA) 10 MG tablet Take 10 mg by mouth daily. 10/19/21  Yes [provider]  tranexamic acid (LYSTEDA) 650 MG TABS tablet Take 1,300 mg by mouth 3 (three) times daily as needed. 07/31/19  Yes [provider]    No Known Allergies  Gynecologic History: Patient's last menstrual period was 10/16/2021.  Obstetric History: No obstetric history on file.  Social History   Socioeconomic History   Marital status: Married    Spouse name: Cornelia Copa   Number of children: 0   Years of education: Associates in [employer] Therapy   Highest education level: Not on file  Occupational History   Not on file  Tobacco Use   Smoking status: Never   Smokeless tobacco: Never  Substance and Sexual Activity   Alcohol use: Yes    Comment: wine - once a week   Drug use: No   Sexual activity: Yes    Birth control/protection: None  Other Topics Concern   Not on file  Social History Narrative   02/19/20   From: the area   Living: with husband, Cornelia Copa (2015)   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, good relationship      Enjoys: travel, spend time with friends      Exercise: walking the block   Diet: meal prep at home - salads, grilled food, limits fried food      Safety   Seat belts: Yes    Guns: Yes  and secure   Safe in relationships: Yes    Social Determinants of Radio broadcast assistant  Strain: Not on file  Food Insecurity: Not on file  Transportation Needs: Not on file  Physical Activity: Not on file  Stress: Not on file  Social Connections: Not on file  Intimate Partner Violence: Not on file    Family History  Problem Relation Age of Onset   Heart disease Maternal Grandmother    Hypertension Paternal Grandmother    Hypertension Paternal Grandfather    Diabetes Mother    Hypertension Mother     Review of Systems  Constitutional:  Negative for chills and fever.  HENT:  Negative for congestion and sore throat.   Eyes:  Negative for blurred vision and double vision.  Respiratory:  Negative for shortness of breath.   Cardiovascular:  Negative for chest pain.  Gastrointestinal:  Negative for heartburn, nausea and vomiting.  Genitourinary: Negative.   Musculoskeletal: Negative.  Negative for myalgias.  Skin:  Negative for rash.  Neurological:  Negative for dizziness and headaches.  Endo/Heme/Allergies:  Does not bruise/bleed easily.  Psychiatric/Behavioral:  Negative for depression. The patient is not nervous/anxious.     Physical Exam BP 124/88   Pulse 90   Temp 97.6 F (36.4 C) (Temporal)   Ht _0  (1.575 m)   Wt 189 lb (85.7 kg)   LMP 10/16/2021   SpO2 99%   BMI 34.57 kg/m    BP Readings from Last 3 Encounters:  10/19/21 124/88  12/15/20 110/62  04/09/20 120/80      Physical Exam Constitutional:      General: She is not in acute distress.    Appearance: She is well-developed. She is not diaphoretic.  HENT:     Head: Normocephalic and atraumatic.     Right Ear: External ear normal.     Left Ear: External ear normal.     Nose: Nose normal.  Eyes:     General: No scleral icterus.    Extraocular Movements: Extraocular movements intact.     Conjunctiva/sclera: Conjunctivae normal.  Cardiovascular:     Rate and Rhythm: Normal rate and regular rhythm.     Heart sounds: No murmur heard. Pulmonary:     Effort: Pulmonary effort is normal. No  respiratory distress.     Breath sounds: Normal breath sounds. No wheezing.  Abdominal:     General: Bowel sounds are normal. There is no distension.     Palpations: Abdomen is soft. There is no mass.     Tenderness: There is no abdominal tenderness. There is no guarding or rebound.  Musculoskeletal:        General: Normal range of motion.     Cervical back: Neck supple.  Lymphadenopathy:     Cervical: No cervical adenopathy.  Skin:    General: Skin is warm and dry.     Capillary Refill: Capillary refill takes less than 2 seconds.  Neurological:     Mental Status: She is alert and oriented to person, place, and time.     Deep Tendon Reflexes: Reflexes normal.  Psychiatric:        Mood and Affect: Mood normal.        Behavior: Behavior normal.    Results:  PHQ-9:  [facility] Office Visit from 04/01/2019 in [facility]  PHQ-9 Total Score 0         Assessment: 51 y.o. No obstetric history on file. female here for routine annual physical examination.  Plan: Problem List Items Addressed This Visit       Other   Anemia    2/2 to fibroid bleeding. Will get iron and blood counts. If persisting she has failed oral iron. Will plan for IV iron infusion       Relevant Orders   CBC   Ferritin   Class 1 obesity with body mass index (BMI) of 33.0 to 33.9 in adult   Relevant Orders   Comprehensive metabolic panel   Lipid panel   TSH   Other Visit Diagnoses     Screening for colon cancer    -  Primary   Relevant Orders  Ambulatory referral to Gastroenterology   Annual physical exam       Need for Tdap vaccination       Relevant Orders   Tdap vaccine greater than or equal to 7yo IM (Completed)       Screening: -- Blood pressure screen normal -- cholesterol screening: will obtain -- Weight screening: overweight: continue to monitor -- Diabetes Screening: will obtain -- Nutrition: Encouraged healthy diet  The 10-year ASCVD risk score  (Arnett DK, et al., 2019) is: 1.1%   Values used to calculate the score:     Age: 80 years     Sex: Female     Is Non-Hispanic African American: Yes     Diabetic: No     Tobacco smoker: No     Systolic Blood Pressure: 419 mmHg     Is BP treated: No     HDL Cholesterol: 88.2 mg/dL     Total Cholesterol: 180 mg/dL  -- Statin therapy for Age 52-75 with CVD risk >7.5%  Psych -- Depression screening (PHQ-9):  [facility] Visit from 04/01/2019 in [facility]  PHQ-9 Total Score 0        Safety -- tobacco screening: not using -- alcohol screening:  low-risk usage. -- no evidence of domestic violence or intimate partner violence.   Cancer Screening -- pap smear request records -- family history of breast cancer screening: done. not at high risk. -- Mammogram - requested -- Colon cancer (age 21+)-- ordered  Immunizations Immunization History  Administered Date(s) Administered   Influenza Inj Mdck Quad Pf 03/24/2021   Influenza,inj,Quad PF,6+ Mos 03/22/2018, 03/11/2020   Influenza-Unspecified 03/22/2018, 03/11/2019, 03/24/2021   PFIZER(Purple Top)SARS-COV-2 Vaccination 05/20/2019, 06/13/2019, 02/27/2020   Pfizer Covid-19 Vaccine Bivalent Booster 23yr & up 03/26/2021   Td 10/26/2010   Tdap 10/19/2021   Unspecified SARS-COV-2 Vaccination 05/20/2019, 06/13/2019, 02/27/2020, 10/01/2020, 03/26/2021   Zoster Recombinat (Shingrix) 04/20/2019, 07/21/2019   Zoster, Live 04/20/2019, 07/21/2019    -- flu vaccine up to date -- TDAP q10 years up to date -- Shingles (age >>44 up to date -- Covid-19 Vaccine up to date   Encouraged healthy diet and exercise. Encouraged regular vision and dental care.    JLesleigh Noe MD

## 2021-10-20 ENCOUNTER — Telehealth: Payer: Self-pay | Admitting: Pharmacy Technician

## 2021-10-20 ENCOUNTER — Other Ambulatory Visit: Payer: Self-pay | Admitting: Family Medicine

## 2021-10-20 DIAGNOSIS — D500 Iron deficiency anemia secondary to blood loss (chronic): Secondary | ICD-10-CM | POA: Insufficient documentation

## 2021-10-20 NOTE — Telephone Encounter (Signed)
Dr. Einar Pheasant, Juluis Rainier note:   Auth Submission: no auth needed Payer: BCBS Medication & CPT/J Code(s) submitted: Venofer (Iron Sucrose) [id number] Route of submission (phone, fax, portal): PORTAL Auth type: Buy/Bill Units/visits requested:  Reference number:  Approval from: 10/20/21 to 02/20/22   Patient will be scheduled as soon as possible.

## 2021-10-20 NOTE — Progress Notes (Signed)
Patient has been taking oral iron and continues to have some fibroid bleeding. She notes fatigue.   Blood counts and iron unimproved. Has failed oral replacement.

## 2021-10-22 ENCOUNTER — Encounter: Payer: Self-pay | Admitting: Family Medicine

## 2021-11-15 ENCOUNTER — Ambulatory Visit: Payer: BC Managed Care – PPO

## 2021-11-15 ENCOUNTER — Other Ambulatory Visit: Payer: Self-pay | Admitting: Pharmacy Technician

## 2021-11-15 MED ORDER — SODIUM CHLORIDE 0.9 % IV SOLN
200.0000 mg | Freq: Once | INTRAVENOUS | Status: DC
  Filled 2021-11-15: qty 10

## 2021-11-16 ENCOUNTER — Encounter: Payer: Self-pay | Admitting: Family Medicine

## 2021-11-17 ENCOUNTER — Ambulatory Visit: Payer: BC Managed Care – PPO

## 2021-11-19 ENCOUNTER — Ambulatory Visit: Payer: BC Managed Care – PPO

## 2021-11-22 ENCOUNTER — Ambulatory Visit: Payer: BC Managed Care – PPO

## 2021-11-24 ENCOUNTER — Ambulatory Visit: Payer: BC Managed Care – PPO

## 2021-11-29 ENCOUNTER — Encounter: Payer: Self-pay | Admitting: Emergency Medicine

## 2021-11-29 ENCOUNTER — Ambulatory Visit
Admission: EM | Admit: 2021-11-29 | Discharge: 2021-11-29 | Disposition: A | Discharge status: Home / Self Care | Payer: BC Managed Care – PPO

## 2021-11-29 DIAGNOSIS — T63484A Toxic effect of venom of other arthropod, undetermined, initial encounter: Secondary | ICD-10-CM | POA: Diagnosis not present

## 2021-11-29 DIAGNOSIS — L03113 Cellulitis of right upper limb: Secondary | ICD-10-CM

## 2021-11-29 MED ORDER — DEXAMETHASONE SODIUM PHOSPHATE 10 MG/ML IJ SOLN
10.0000 mg | Freq: Once | INTRAMUSCULAR | Status: AC
  Administered 2021-11-29: 10 mg via INTRAMUSCULAR

## 2021-11-29 MED ORDER — PREDNISONE 20 MG PO TABS
20.0000 mg | ORAL_TABLET | Freq: Every day | ORAL | 0 refills | Status: AC
Start: 2021-11-29 — End: 2021-12-04

## 2021-11-29 MED ORDER — DOXYCYCLINE HYCLATE 100 MG PO CAPS: 100.0000 mg | ORAL_CAPSULE | Freq: Two times a day (BID) | ORAL | 0 refills | Status: AC

## 2021-11-29 NOTE — ED Triage Notes (Signed)
Pt was stung by a wasp 2 days ago. She has some right arm swelling and pain. She has taken benadryl and ibuprofen.

## 2021-11-29 NOTE — ED Provider Notes (Signed)
[name]    CSN: [id number] Arrival date & time: 11/29/21  0807      History   Chief Complaint Chief Complaint  Patient presents with   Insect Bite    HPI Sydney [name] is a 51 y.o. female.   HPI Patient presents for evaluation of arm swelling, redness, and discomfort, following a wasp sting injury which occurred 2 days ago.  She reports initially following the wife stating having some residual swelling localized at the site of which the injury occurred.  She has been taking Benadryl over the last 2 days however overnight her arm began to swell and the swelling has extended down to her hand.  She is exhibiting tightness with bending her right arm.  She also has swelling in her hand in addition to increased warmth and redness medial lateral aspect of her right arm.  She is afebrile.  Denies any other systemic symptoms related to wasp sting. Past Medical History:  Diagnosis Date   Abnormal bleeding in menstrual cycle    Fibroids    Headache(784.0)    otc med prn   Hemorrhoids    IBS (irritable bowel syndrome)    Raised intraocular pressure of both eyes    followed yearly by Dr. Katy Fitch    Patient Active Problem List   Diagnosis Date Noted   Iron deficiency anemia due to chronic blood loss 10/20/2021   Class 1 obesity with body mass index (BMI) of 33.0 to 33.9 in adult 05/08/2019   Perimenopausal 04/01/2019   Ganglion cyst of dorsum of right wrist 04/01/2019   Anemia 03/29/2019   IBS (irritable bowel syndrome) 10/15/2013   Vitamin D deficiency 10/15/2013   Migraine 08/30/2007   SLOW TRANSIT CONSTIPATION 08/30/2007    Past Surgical History:  Procedure Laterality Date   COLONOSCOPY     HYSTEROSCOPY WITH D & C  10/14/2011   Procedure: DILATATION AND CURETTAGE /HYSTEROSCOPY;  Surgeon: Luz Lex, MD;  Location: [facility];  Service: Gynecology;  Laterality: N/A;  WITH TRUCLEAR morcellation   INDUCED ABORTION     MYOMECTOMY  2013   NM RENAL LASIX ([facility]  HX) Bilateral    UPPER GASTROINTESTINAL ENDOSCOPY     UTERINE FIBROID SURGERY  08/13/2015   Dr. Corinna Capra   WISDOM TOOTH EXTRACTION      OB History   No obstetric history on file.      Home Medications    Prior to Admission medications   Medication Sig Start Date End Date Taking? Authorizing Provider  doxycycline (VIBRAMYCIN) 100 MG capsule Take 1 capsule (100 mg total) by mouth 2 (two) times daily for 7 days. 11/29/21 12/06/21 Yes Scot Jun, FNP  predniSONE (DELTASONE) 20 MG tablet Take 1 tablet (20 mg total) by mouth daily with breakfast for 5 days. 11/29/21 12/04/21 Yes Scot Jun, FNP  B Complex-C (B-COMPLEX WITH VITAMIN C) tablet Take 1 tablet by mouth daily.    [provider]  ibuprofen (ADVIL) 800 MG tablet Take 1 tablet by mouth 3 (three) times daily as needed.    [provider]  ibuprofen (ADVIL,MOTRIN) 800 MG tablet Take 800 mg by mouth every 4 (four) hours as needed. For cramps-- pt says she does take every 4-6 hours if needed for major cramping    [provider]  medroxyPROGESTERone (PROVERA) 10 MG tablet Take 10 mg by mouth daily. 10/19/21   [provider]  tranexamic acid (LYSTEDA) 650 MG TABS tablet Take 1,300 mg by mouth  3 (three) times daily as needed. 07/31/19   [provider]    Family History Family History  Problem Relation Age of Onset   Heart disease Maternal Grandmother    Hypertension Paternal Grandmother    Hypertension Paternal Grandfather    Diabetes Mother    Hypertension Mother     Social History Social History   Tobacco Use   Smoking status: Never   Smokeless tobacco: Never  Vaping Use   Vaping Use: Never used  Substance Use Topics   Alcohol use: Yes    Comment: wine - once a week   Drug use: No     Allergies   Patient has no known allergies.   Review of Systems Review of Systems Pertinent negatives listed in HPI  Physical Exam Triage Vital Signs ED Triage Vitals [11/29/21  0816]  Enc Vitals Group     BP (!) 154/90     Pulse Rate 92     Resp 16     Temp 98.2 F (36.8 C)     Temp Source Oral     SpO2 98 %     Weight      Height      Head Circumference      Peak Flow      Pain Score 8     Pain Loc      Pain Edu?      Excl. in [place]?    No data found.  Updated Vital Signs BP (!) 144/94 (BP Location: Left Arm)   Pulse 92   Temp 98.2 F (36.8 C) (Oral)   Resp 16   LMP  (LMP Unknown)   SpO2 98%   Visual Acuity Right Eye Distance:   Left Eye Distance:   Bilateral Distance:    Right Eye Near:   Left Eye Near:    Bilateral Near:     Physical Exam Constitutional:      Appearance: Normal appearance.  HENT:     Head: Normocephalic.  Cardiovascular:     Rate and Rhythm: Normal rate and regular rhythm.  Pulmonary:     Effort: Pulmonary effort is normal.     Breath sounds: Normal breath sounds.  Musculoskeletal:     Right elbow: Swelling present. Decreased range of motion.     Left elbow: Normal.     Right forearm: Swelling and tenderness present.     Cervical back: Normal range of motion.     Comments: Erythema midlateral inner forearm  Skin:    General: Skin is warm and dry.     Capillary Refill: Capillary refill takes less than 2 seconds.  Neurological:     General: No focal deficit present.     Mental Status: She is alert and oriented to person, place, and time.    UC Treatments / Results  Labs (all labs ordered are listed, but only abnormal results are displayed) Labs Reviewed - No data to display  EKG   Radiology No results found.  Procedures Procedures (including critical care time)  Medications Ordered in UC Medications  dexamethasone (DECADRON) injection 10 mg (has no administration in time range)    Initial Impression / Assessment and Plan / UC Course  I have reviewed the triage vital signs and the nursing notes.  Pertinent labs & imaging results that were available during my care of the patient were reviewed  by me and considered in my medical decision making (see chart for details).    Allergic reaction from insect  sting with cellulitis of right arm Decadron IM given here in clinic. Start oral prednisone tomorrow daily x 5 days. Start Doxycyline twice daily x 7 days. Return if symptoms worsen or do not improve. Final Clinical Impressions(s) / UC Diagnoses   Final diagnoses:  Allergic reaction to insect sting, undetermined intent, initial encounter  Cellulitis of arm, right   Discharge Instructions   None    ED Prescriptions     Medication Sig Dispense Auth. Provider   doxycycline (VIBRAMYCIN) 100 MG capsule Take 1 capsule (100 mg total) by mouth 2 (two) times daily for 7 days. 14 capsule Scot Jun, FNP   predniSONE (DELTASONE) 20 MG tablet Take 1 tablet (20 mg total) by mouth daily with breakfast for 5 days. 5 tablet Scot Jun, FNP      PDMP not reviewed this encounter.   Scot Jun, FNP 11/29/21 [phone]

## 2021-12-07 ENCOUNTER — Encounter: Payer: Self-pay | Admitting: Gastroenterology

## 2021-12-16 ENCOUNTER — Other Ambulatory Visit: Payer: Self-pay | Admitting: Pharmacy Technician

## 2021-12-17 ENCOUNTER — Ambulatory Visit (AMBULATORY_SURGERY_CENTER): Payer: Self-pay | Admitting: *Deleted

## 2021-12-17 VITALS — Ht 62.0 in | Wt 180.0 lb

## 2021-12-17 DIAGNOSIS — Z1211 Encounter for screening for malignant neoplasm of colon: Secondary | ICD-10-CM

## 2021-12-17 MED ORDER — NA SULFATE-K SULFATE-MG SULF 17.5-3.13-1.6 GM/177ML PO SOLN: 1.0000 | Freq: Once | ORAL | 0 refills | Status: AC

## 2021-12-17 NOTE — Progress Notes (Signed)
No egg or soy allergy known to patient  No issues known to pt with past sedation with any surgeries or procedures Patient denies ever being told they had issues or difficulty with intubation  No FH of Malignant Hyperthermia Pt is not on diet pills Pt is not on  home 02  Pt is not on blood thinners   Pt states  issues with constipation - maybe has a BM 2 x a week on a good week- uses Sena all natural PRN- will do a 2 day Suprep with extra miralax daily x 5 days prior to her colon   No A fib or A flutter Have any cardiac testing pending--pt denies   Pt instructed to use [email or web address] for a price reduction on prep

## 2021-12-22 ENCOUNTER — Ambulatory Visit (INDEPENDENT_AMBULATORY_CARE_PROVIDER_SITE_OTHER): Payer: BC Managed Care – PPO

## 2021-12-22 VITALS — BP 135/88 | HR 89 | Temp 98.2°F | Resp 100 | Ht 62.0 in | Wt 186.6 lb

## 2021-12-22 DIAGNOSIS — D500 Iron deficiency anemia secondary to blood loss (chronic): Secondary | ICD-10-CM | POA: Diagnosis not present

## 2021-12-22 MED ORDER — SODIUM CHLORIDE 0.9 % IV SOLN
200.0000 mg | Freq: Once | INTRAVENOUS | Status: AC
  Administered 2021-12-22: 200 mg via INTRAVENOUS
  Filled 2021-12-22: qty 10

## 2021-12-22 NOTE — Progress Notes (Signed)
Diagnosis: Iron Deficiency Anemia  Provider:  Marshell Garfinkel, MD  Procedure: Infusion  IV Type: Peripheral, IV Location: L Antecubital  Venofer (Iron Sucrose), Dose: 200 mg  Infusion Start Time: 1110  Infusion Stop Time: 1132  Post Infusion IV Care: Observation period completed and Peripheral IV Discontinued  Discharge: Condition: Good, Destination: Home . AVS provided to patient.   Performed by:  Koren Shiver, RN

## 2021-12-24 ENCOUNTER — Ambulatory Visit (INDEPENDENT_AMBULATORY_CARE_PROVIDER_SITE_OTHER): Payer: BC Managed Care – PPO

## 2021-12-24 VITALS — BP 137/91 | HR 85 | Temp 98.0°F | Resp 20 | Ht 62.0 in | Wt 188.2 lb

## 2021-12-24 DIAGNOSIS — D500 Iron deficiency anemia secondary to blood loss (chronic): Secondary | ICD-10-CM | POA: Diagnosis not present

## 2021-12-24 MED ORDER — SODIUM CHLORIDE 0.9 % IV SOLN
200.0000 mg | Freq: Once | INTRAVENOUS | Status: AC
  Administered 2021-12-24: 200 mg via INTRAVENOUS
  Filled 2021-12-24: qty 10

## 2021-12-24 NOTE — Progress Notes (Signed)
Diagnosis: Iron Deficiency Anemia  Provider:  Marshell Garfinkel, MD  Procedure: Infusion  IV Type: Peripheral, IV Location: R Antecubital  Venofer (Iron Sucrose), Dose: 200 mg  Infusion Start Time: 0413  Infusion Stop Time: 6438  Post Infusion IV Care: Peripheral IV Discontinued  Discharge: Condition: Good, Destination: Home . AVS provided to patient.   Performed by:  Koren Shiver, RN

## 2021-12-25 ENCOUNTER — Encounter: Payer: Self-pay | Admitting: Family Medicine

## 2021-12-27 ENCOUNTER — Encounter: Payer: Self-pay | Admitting: Family Medicine

## 2021-12-27 ENCOUNTER — Ambulatory Visit (INDEPENDENT_AMBULATORY_CARE_PROVIDER_SITE_OTHER): Payer: BC Managed Care – PPO

## 2021-12-27 VITALS — BP 146/92 | HR 76 | Temp 98.3°F | Resp 16 | Ht 62.0 in | Wt 189.0 lb

## 2021-12-27 DIAGNOSIS — D500 Iron deficiency anemia secondary to blood loss (chronic): Secondary | ICD-10-CM

## 2021-12-27 MED ORDER — SODIUM CHLORIDE 0.9 % IV SOLN
200.0000 mg | Freq: Once | INTRAVENOUS | Status: AC
  Administered 2021-12-27: 200 mg via INTRAVENOUS
  Filled 2021-12-27: qty 10

## 2021-12-27 NOTE — Progress Notes (Signed)
Diagnosis: Iron Deficiency Anemia  Provider:  Marshell Garfinkel, MD  Procedure: Infusion  IV Type: Peripheral, IV Location: Left wrist  Venofer (Iron Sucrose), Dose: 200 mg  Infusion Start Time: 1324  Infusion Stop Time: 4010  Post Infusion IV Care: Peripheral IV Discontinued  Discharge: Condition: Good, Destination: Home . AVS provided to patient.   Performed by:  Koren Shiver, RN

## 2021-12-29 ENCOUNTER — Ambulatory Visit: Payer: BC Managed Care – PPO

## 2021-12-31 ENCOUNTER — Ambulatory Visit (INDEPENDENT_AMBULATORY_CARE_PROVIDER_SITE_OTHER): Payer: BC Managed Care – PPO

## 2021-12-31 VITALS — BP 138/90 | HR 86 | Temp 97.7°F | Resp 18 | Ht 62.0 in | Wt 189.8 lb

## 2021-12-31 DIAGNOSIS — D500 Iron deficiency anemia secondary to blood loss (chronic): Secondary | ICD-10-CM

## 2021-12-31 MED ORDER — SODIUM CHLORIDE 0.9 % IV SOLN
200.0000 mg | Freq: Once | INTRAVENOUS | Status: AC
  Administered 2021-12-31: 200 mg via INTRAVENOUS
  Filled 2021-12-31: qty 10

## 2021-12-31 NOTE — Progress Notes (Signed)
Diagnosis: Iron Deficiency Anemia  Provider:  Marshell Garfinkel, MD  Procedure: Infusion  IV Type: Peripheral, IV Location: L Forearm  Venofer (Iron Sucrose), Dose: 200 mg  Infusion Start Time: 2947  Infusion Stop Time: 6546  Post Infusion IV Care: Peripheral IV Discontinued  Discharge: Condition: Good, Destination: Home . AVS provided to patient.   Performed by:  Cleophus Molt, RN

## 2022-01-04 ENCOUNTER — Encounter: Payer: Self-pay | Admitting: Gastroenterology

## 2022-01-07 ENCOUNTER — Encounter: Payer: Self-pay | Admitting: Gastroenterology

## 2022-01-07 ENCOUNTER — Ambulatory Visit (AMBULATORY_SURGERY_CENTER): Payer: BC Managed Care – PPO | Admitting: Gastroenterology

## 2022-01-07 VITALS — BP 145/97 | HR 69 | Temp 97.3°F | Resp 15 | Ht 62.0 in | Wt 180.0 lb

## 2022-01-07 DIAGNOSIS — Z1211 Encounter for screening for malignant neoplasm of colon: Secondary | ICD-10-CM

## 2022-01-07 DIAGNOSIS — D124 Benign neoplasm of descending colon: Secondary | ICD-10-CM

## 2022-01-07 MED ORDER — SODIUM CHLORIDE 0.9 % IV SOLN: 500.0000 mL | Freq: Once | INTRAVENOUS | Status: DC

## 2022-01-07 NOTE — Op Note (Signed)
[facility] Patient Name: Sydney [name] Procedure Date: 01/07/2022 10:33 AM MRN: [id number] Endoscopist: Thornton Park MD, MD Age: 51 Referring MD:  Date of Birth: 18-Mar-1971 Gender: Female Account #: [id number] Procedure:                Colonoscopy Indications:              Screening for colorectal malignant neoplasm                           No known family history of colon cancer or polyps                           Colonoscopy with Dr. Collene Mares many years ago during the                            evaluation of IBS Medicines:                Monitored Anesthesia Care Procedure:                Pre-Anesthesia Assessment:                           - Prior to the procedure, a History and Physical                            was performed, and patient medications and                            allergies were reviewed. The patient's tolerance of                            previous anesthesia was also reviewed. The risks                            and benefits of the procedure and the sedation                            options and risks were discussed with the patient.                            All questions were answered, and informed consent                            was obtained. Prior Anticoagulants: The patient has                            taken no previous anticoagulant or antiplatelet                            agents. ASA Grade Assessment: II - A patient with                            mild systemic disease. After reviewing the risks  and benefits, the patient was deemed in                            satisfactory condition to undergo the procedure.                           After obtaining informed consent, the colonoscope                            was passed under direct vision. Throughout the                            procedure, the patient's blood pressure, pulse, and                            oxygen saturations were monitored  continuously. The                            Olympus CF-HQ190L (Serial# [id number]) Colonoscope was                            introduced through the anus and advanced to the 3                            cm into the ileum. A second forward view of the                            right colon was performed. The colonoscopy was                            performed without difficulty. The patient tolerated                            the procedure well. The quality of the bowel                            preparation was excellent. The terminal ileum,                            ileocecal valve, appendiceal orifice, and rectum                            were photographed. Scope In: 10:55:11 AM Scope Out: 11:14:25 AM Scope Withdrawal Time: 0 hours 12 minutes 55 seconds  Total Procedure Duration: 0 hours 19 minutes 14 seconds  Findings:                 The perianal and digital rectal examinations were                            normal.                           A 4-5 mm polyp was found in the proximal descending  colon. The polyp was sessile. The polyp was removed                            with a cold snare. Resection and retrieval were                            complete. Estimated blood loss was minimal.                           The exam was otherwise without abnormality on                            direct and retroflexion views. Complications:            No immediate complications. Estimated Blood Loss:     Estimated blood loss was minimal. Impression:               - One 4-5 mm polyp in the proximal descending                            colon, removed with a cold snare. Resected and                            retrieved.                           - The examination was otherwise normal on direct                            and retroflexion views. Recommendation:           - Patient has a contact number available for                            emergencies. The signs and  symptoms of potential                            delayed complications were discussed with the                            patient. Return to normal activities tomorrow.                            Written discharge instructions were provided to the                            patient.                           - Resume previous diet.                           - Continue present medications.                           - Await pathology results.                           -  Repeat colonoscopy date to be determined after                            pending pathology results are reviewed for                            surveillance.                           - Emerging evidence supports eating a diet of                            fruits, vegetables, grains, calcium, and yogurt                            while reducing red meat and alcohol may reduce the                            risk of colon cancer.                           - Thank you for allowing me to be involved in your                            colon cancer prevention. Thornton Park MD, MD 01/07/2022 11:21:30 AM This report has been signed electronically.

## 2022-01-07 NOTE — Progress Notes (Signed)
Called to room to assist during endoscopic procedure.  Patient ID and intended procedure confirmed with present staff. Received instructions for my participation in the procedure from the performing physician.  

## 2022-01-07 NOTE — Progress Notes (Signed)
Vital signs checked by:AS  The patient states no changes in medical or surgical history since pre-visit screening on 12/17/21.

## 2022-01-07 NOTE — Progress Notes (Signed)
PT taken to PACU. Monitors in place. VSS. Report given to RN. 

## 2022-01-07 NOTE — Patient Instructions (Signed)
Resume previous medications.  Await results for final recommendations.  Handouts on findings given to patient (1 polyp).     YOU HAD AN ENDOSCOPIC PROCEDURE TODAY AT [facility]:   Refer to the procedure report that was given to you for any specific questions about what was found during the examination.  If the procedure report does not answer your questions, please call your gastroenterologist to clarify.  If you requested that your care partner not be given the details of your procedure findings, then the procedure report has been included in a sealed envelope for you to review at your convenience later.  YOU SHOULD EXPECT: Some feelings of bloating in the abdomen. Passage of more gas than usual.  Walking can help get rid of the air that was put into your GI tract during the procedure and reduce the bloating. If you had a lower endoscopy (such as a colonoscopy or flexible sigmoidoscopy) you may notice spotting of blood in your stool or on the toilet paper. If you underwent a bowel prep for your procedure, you may not have a normal bowel movement for a few days.  Please Note:  You might notice some irritation and congestion in your nose or some drainage.  This is from the oxygen used during your procedure.  There is no need for concern and it should clear up in a day or so.  SYMPTOMS TO REPORT IMMEDIATELY:  Following lower endoscopy (colonoscopy or flexible sigmoidoscopy):  Excessive amounts of blood in the stool  Significant tenderness or worsening of abdominal pains  Swelling of the abdomen that is new, acute  Fever of 100F or higher   For urgent or emergent issues, a gastroenterologist can be reached at any hour by calling [phone]. Do not use MyChart messaging for urgent concerns.    DIET:  We do recommend a small meal at first, but then you may proceed to your regular diet.  Drink plenty of fluids but you should avoid alcoholic beverages for 24 hours.  ACTIVITY:   You should plan to take it easy for the rest of today and you should NOT DRIVE or use heavy machinery until tomorrow (because of the sedation medicines used during the test).    FOLLOW UP: Our staff will call the number listed on your records the next business day following your procedure.  We will call around 7:15- 8:00 am to check on you and address any questions or concerns that you may have regarding the information given to you following your procedure. If we do not reach you, we will leave a message.  If you develop any symptoms (ie: fever, flu-like symptoms, shortness of breath, cough etc.) before then, please call [phone].  If you test positive for Covid 19 in the 2 weeks post procedure, please call and report this information to [place].    If any biopsies were taken you will be contacted by phone or by letter within the next 1-3 weeks.  Please call us at [phone] if you have not heard about the biopsies in 3 weeks.    SIGNATURES/CONFIDENTIALITY: You and/or your care partner have signed paperwork which will be entered into your electronic medical record.  These signatures attest to the fact that that the information above on your After Visit Summary has been reviewed and is understood.  Full responsibility of the confidentiality of this discharge information lies with you and/or your care-partner.

## 2022-01-07 NOTE — Progress Notes (Signed)
Referring Provider: Lesleigh Noe, MD Primary Care Physician:  Lesleigh Noe, MD  Indication for Procedure:  Colon cancer screening   IMPRESSION:  Need for colon cancer screening Appropriate candidate for monitored anesthesia care  PLAN: Colonoscopy in the [facility] today   HPI: Sydney [name] is a 51 y.o. female presents for screening colonoscopy.  No prior colonoscopy or colon cancer screening.  No baseline GI symptoms.   No known family history of colon cancer or polyps. No family history of uterine/endometrial cancer, pancreatic cancer or gastric/stomach cancer.   Past Medical History:  Diagnosis Date   Abnormal bleeding in menstrual cycle    Anemia    Constipation    Fibroids    Headache(784.0)    otc med prn   Hemorrhoids    IBS (irritable bowel syndrome)    Raised intraocular pressure of both eyes    followed yearly by Dr. Mickie Hillier    Past Surgical History:  Procedure Laterality Date   COLONOSCOPY     HYSTEROSCOPY WITH D & C  10/14/2011   Procedure: DILATATION AND CURETTAGE /HYSTEROSCOPY;  Surgeon: Luz Lex, MD;  Location: [facility];  Service: Gynecology;  Laterality: N/A;  WITH TRUCLEAR morcellation   MYOMECTOMY  2013   x2   NM RENAL LASIX ([facility] HX) Bilateral    UPPER GASTROINTESTINAL ENDOSCOPY     UTERINE FIBROID SURGERY  08/13/2015   Dr. Corinna Capra   WISDOM TOOTH EXTRACTION      Current Outpatient Medications  Medication Sig Dispense Refill   B Complex-C (B-COMPLEX WITH VITAMIN C) tablet Take 1 tablet by mouth daily.     Cetirizine HCl (ZYRTEC ALLERGY) 10 MG CAPS Take by oral route.     ibuprofen (ADVIL,MOTRIN) 800 MG tablet Take 800 mg by mouth every 4 (four) hours as needed. For cramps-- pt says she does take every 4-6 hours if needed for major cramping     tranexamic acid (LYSTEDA) 650 MG TABS tablet Take 1,300 mg by mouth 3 (three) times daily as needed.     Current Facility-Administered Medications  Medication Dose Route Frequency Provider  Last Rate Last Admin   0.9 %  sodium chloride infusion  500 mL Intravenous Once Thornton Park, MD        Allergies as of 01/07/2022   (No Known Allergies)    Family History  Problem Relation Age of Onset   Diabetes Mother    Hypertension Mother    Prostate cancer Paternal Uncle    Cancer Paternal Uncle    Heart disease Maternal Grandmother    Hypertension Paternal Grandmother    Stomach cancer Paternal Grandfather    Hypertension Paternal Grandfather    Cancer Paternal Grandfather    Colon cancer Neg Hx    Colon polyps Neg Hx    Esophageal cancer Neg Hx    Rectal cancer Neg Hx      Physical Exam: General:   Alert,  well-nourished, pleasant and cooperative in NAD Head:  Normocephalic and atraumatic. Eyes:  Sclera clear, no icterus.   Conjunctiva pink. Mouth:  No deformity or lesions.   Neck:  Supple; no masses or thyromegaly. Lungs:  Clear throughout to auscultation.   No wheezes. Heart:  Regular rate and rhythm; no murmurs. Abdomen:  Soft, non-tender, nondistended, normal bowel sounds, no rebound or guarding.  Msk:  Symmetrical. No boney deformities LAD: No inguinal or umbilical LAD Extremities:  No clubbing or edema. Neurologic:  Alert and  oriented x4;  grossly nonfocal  Skin:  No obvious rash or bruise. Psych:  Alert and cooperative. Normal mood and affect.     Studies/Results: No results found.    Luie Laneve L. Tarri Glenn, MD, MPH 01/07/2022, 10:35 AM

## 2022-01-10 ENCOUNTER — Telehealth: Payer: Self-pay

## 2022-01-10 NOTE — Telephone Encounter (Signed)
No answer, left message to call if having any issues or concerns, B.Darshan Solanki RN 

## 2022-01-11 ENCOUNTER — Encounter: Payer: Self-pay | Admitting: Gastroenterology

## 2022-01-23 ENCOUNTER — Ambulatory Visit
Admission: EM | Admit: 2022-01-23 | Discharge: 2022-01-23 | Disposition: A | Discharge status: Home / Self Care | Payer: BC Managed Care – PPO | Attending: Emergency Medicine | Admitting: Emergency Medicine

## 2022-01-23 DIAGNOSIS — T63441A Toxic effect of venom of bees, accidental (unintentional), initial encounter: Secondary | ICD-10-CM

## 2022-01-23 DIAGNOSIS — R220 Localized swelling, mass and lump, head: Secondary | ICD-10-CM

## 2022-01-23 MED ORDER — DEXAMETHASONE SODIUM PHOSPHATE 10 MG/ML IJ SOLN
10.0000 mg | Freq: Once | INTRAMUSCULAR | Status: AC
  Administered 2022-01-23: 10 mg via INTRAMUSCULAR

## 2022-01-23 MED ORDER — PREDNISONE 10 MG PO TABS: 40.0000 mg | ORAL_TABLET | Freq: Every day | ORAL | 0 refills | Status: AC

## 2022-01-23 NOTE — Discharge Instructions (Addendum)
You were given an injection of a steroid called dexamethasone.  Start the prednisone tomorrow as directed.    Take Benadryl or Zyrtec as directed.    Follow up with your primary care provider.   Go to the emergency department if you have difficulty swallowing or breathing.

## 2022-01-23 NOTE — ED Provider Notes (Signed)
[name]    CSN: [id number] Arrival date & time: 01/23/22  0840      History   Chief Complaint Chief Complaint  Patient presents with   Allergic Reaction    HPI Sydney [name] is a 51 y.o. female.  Patient presents with a wasp sting on her chin that occurred yesterday.  The area is swollen and painful.  Treatment at home with Benadryl; last taken at midnight.  No OTC medications today.  No difficulty swallowing or breathing.  No fever, chills, or other symptoms.  The history is provided by the patient and medical records.    Past Medical History:  Diagnosis Date   Abnormal bleeding in menstrual cycle    Anemia    Constipation    Fibroids    Headache(784.0)    otc med prn   Hemorrhoids    IBS (irritable bowel syndrome)    Raised intraocular pressure of both eyes    followed yearly by Dr. Mickie Hillier    Patient Active Problem List   Diagnosis Date Noted   Iron deficiency anemia due to chronic blood loss 10/20/2021   Class 1 obesity with body mass index (BMI) of 33.0 to 33.9 in adult 05/08/2019   Perimenopausal 04/01/2019   Ganglion cyst of dorsum of right wrist 04/01/2019   Anemia 03/29/2019   IBS (irritable bowel syndrome) 10/15/2013   Vitamin D deficiency 10/15/2013   Migraine 08/30/2007   SLOW TRANSIT CONSTIPATION 08/30/2007    Past Surgical History:  Procedure Laterality Date   COLONOSCOPY     HYSTEROSCOPY WITH D & C  10/14/2011   Procedure: DILATATION AND CURETTAGE /HYSTEROSCOPY;  Surgeon: Luz Lex, MD;  Location: [facility];  Service: Gynecology;  Laterality: N/A;  WITH TRUCLEAR morcellation   MYOMECTOMY  2013   x2   NM RENAL LASIX ([place] HX) Bilateral    UPPER GASTROINTESTINAL ENDOSCOPY     UTERINE FIBROID SURGERY  08/13/2015   Dr. Corinna Capra   WISDOM TOOTH EXTRACTION      OB History   No obstetric history on file.      Home Medications    Prior to Admission medications   Medication Sig Start Date End Date Taking? Authorizing  Provider  predniSONE (DELTASONE) 10 MG tablet Take 4 tablets (40 mg total) by mouth daily for 3 days. 01/24/22 01/27/22 Yes Sharion Balloon, NP  B Complex-C (B-COMPLEX WITH VITAMIN C) tablet Take 1 tablet by mouth daily.    [provider]  Cetirizine HCl (ZYRTEC ALLERGY) 10 MG CAPS Take by oral route.    [provider]  ibuprofen (ADVIL,MOTRIN) 800 MG tablet Take 800 mg by mouth every 4 (four) hours as needed. For cramps-- pt says she does take every 4-6 hours if needed for major cramping    [provider]  tranexamic acid (LYSTEDA) 650 MG TABS tablet Take 1,300 mg by mouth 3 (three) times daily as needed. 07/31/19   [provider]    Family History Family History  Problem Relation Age of Onset   Diabetes Mother    Hypertension Mother    Prostate cancer Paternal Uncle    Cancer Paternal Uncle    Heart disease Maternal Grandmother    Hypertension Paternal Grandmother    Stomach cancer Paternal Grandfather    Hypertension Paternal Grandfather    Cancer Paternal Grandfather    Colon cancer Neg Hx    Colon polyps Neg Hx    Esophageal cancer Neg Hx  Rectal cancer Neg Hx     Social History Social History   Tobacco Use   Smoking status: Never   Smokeless tobacco: Never  Vaping Use   Vaping Use: Never used  Substance Use Topics   Alcohol use: Yes    Comment: wine - twice  a week   Drug use: No     Allergies   Patient has no known allergies.   Review of Systems Review of Systems  Constitutional:  Negative for chills and fever.  HENT:  Positive for facial swelling. Negative for sore throat and trouble swallowing.   Respiratory:  Negative for cough and shortness of breath.   Skin:  Positive for wound. Negative for color change.  All other systems reviewed and are negative.    Physical Exam Triage Vital Signs ED Triage Vitals  Enc Vitals Group     BP 01/23/22 0921 131/88     Pulse Rate 01/23/22 0921 78     Resp 01/23/22 0921 18      Temp 01/23/22 0921 98.2 F (36.8 C)     Temp src --      SpO2 01/23/22 0921 100 %     Weight 01/23/22 0919 180 lb (81.6 kg)     Height 01/23/22 0919 '5\' 2"'$  (1.575 m)     Head Circumference --      Peak Flow --      Pain Score 01/23/22 0919 4     Pain Loc --      Pain Edu? --      Excl. in [place]? --    No data found.  Updated Vital Signs BP 131/88   Pulse 78   Temp 98.2 F (36.8 C)   Resp 18   Ht '5\' 2"'$  (1.575 m)   Wt 180 lb (81.6 kg)   SpO2 100%   BMI 32.92 kg/m   Visual Acuity Right Eye Distance:   Left Eye Distance:   Bilateral Distance:    Right Eye Near:   Left Eye Near:    Bilateral Near:     Physical Exam Vitals and nursing note reviewed.  Constitutional:      General: She is not in acute distress.    Appearance: She is well-developed. She is not ill-appearing.  HENT:     Mouth/Throat:     Mouth: Mucous membranes are moist.     Pharynx: Oropharynx is clear.     Comments: Speech clear.  No oropharyngeal swelling.  No difficulty swallowing. Cardiovascular:     Rate and Rhythm: Normal rate and regular rhythm.     Heart sounds: Normal heart sounds.  Pulmonary:     Effort: Pulmonary effort is normal. No respiratory distress.     Breath sounds: Normal breath sounds.     Comments: Good air movement. Musculoskeletal:     Cervical back: Neck supple.  Skin:    General: Skin is warm and dry.     Findings: Lesion present.     Comments: Localized edema of chin with approximately 1 cm area of induration with central 1 mm wound.  No drainage.  Neurological:     Mental Status: She is alert.  Psychiatric:        Mood and Affect: Mood normal.        Behavior: Behavior normal.      UC Treatments / Results  Labs (all labs ordered are listed, but only abnormal results are displayed) Labs Reviewed - No data to display  EKG  Radiology No results found.  Procedures Procedures (including critical care time)  Medications Ordered in UC Medications   dexamethasone (DECADRON) injection 10 mg (10 mg Intramuscular Given 01/23/22 1008)    Initial Impression / Assessment and Plan / UC Course  I have reviewed the triage vital signs and the nursing notes.  Pertinent labs & imaging results that were available during my care of the patient were reviewed by me and considered in my medical decision making (see chart for details).    Bee sting with localized swelling of chin.  No difficulty swallowing or breathing.  Vital signs stable.  Patient request injection of dexamethasone as this has worked well for her in the past.  Starting 3-day prednisone tomorrow.  Discussed Benadryl or Zyrtec.  Instructed her to follow-up with her PCP.  ED precautions discussed.  Patient agrees to plan of care.  Final Clinical Impressions(s) / UC Diagnoses   Final diagnoses:  Bee sting, accidental or unintentional, initial encounter  Facial swelling     Discharge Instructions      You were given an injection of a steroid called dexamethasone.  Start the prednisone tomorrow as directed.    Take Benadryl or Zyrtec as directed.    Follow up with your primary care provider.   Go to the emergency department if you have difficulty swallowing or breathing.         ED Prescriptions     Medication Sig Dispense Auth. Provider   predniSONE (DELTASONE) 10 MG tablet Take 4 tablets (40 mg total) by mouth daily for 3 days. 12 tablet Sharion Balloon, NP      PDMP not reviewed this encounter.   Sharion Balloon, NP 01/23/22 1016

## 2022-01-23 NOTE — ED Triage Notes (Signed)
Patient to Urgent Care with complaints of a possible allergic reaction. Reports being stung by a wasp on her face yesterday followed by swelling and discomfort. History of the same. Has been taking benadryl.

## 2022-03-18 ENCOUNTER — Encounter: Payer: Self-pay | Admitting: Gastroenterology

## 2022-03-18 ENCOUNTER — Telehealth: Payer: Self-pay | Admitting: Family Medicine

## 2022-03-18 ENCOUNTER — Ambulatory Visit: Payer: BC Managed Care – PPO | Admitting: Gastroenterology

## 2022-03-18 VITALS — BP 154/102 | HR 86 | Ht 62.0 in | Wt 192.0 lb

## 2022-03-18 DIAGNOSIS — K589 Irritable bowel syndrome without diarrhea: Secondary | ICD-10-CM

## 2022-03-18 MED ORDER — LINACLOTIDE 145 MCG PO CAPS: 145.0000 ug | ORAL_CAPSULE | Freq: Every day | ORAL | 3 refills | Status: DC

## 2022-03-18 NOTE — Patient Instructions (Addendum)
I recommend that you eat at least 25-30 grams of fiber daily and drink at least 64 ounces of water daily. You will want to gradually increase the fiber in your diet to avoid bloating.   Natural laxatives include prunes, apples, apricots, cherries, peaches, pears, aloe, rhubarb, kiwi, bananas, mango, papaya, and watermelon. In particular, two kiwi a day has been show to cause less likely to cause bloating than prunes or psyllium.  As long as you have healthy kidneys, another options is using magnesium oxide supplements. I recommend starting with 500 mg daily. You could increase the dose to 1000 mg daily after one week if that doesn't seem to be helping.   We discussed a trial of Linzess 145 mcg daily. If this is not providing enough relief, we can increase to 290 mcg daily.   Please consider seeing Earlie Counts. She can help retrain the muscles to work for effectively.  Please send me a My Chart message to let me know how you are doing with the [place].

## 2022-03-18 NOTE — Telephone Encounter (Signed)
Pt has made a new patient appointment transferring her care from Dr. Einar Pheasant to Dr. Grandville Silos via Deloris Ping, if this a concern please let me know.

## 2022-03-18 NOTE — Progress Notes (Signed)
Referring Provider: Waunita Schooner, MD Primary Care Physician:  Waunita Schooner, MD  Reason for Consultation:  IBS   IMPRESSION:  IBS-Constipation +/- chronic idiopathic constipation +/- pelvic floor dysfunction.   History of colon polyp.  Tubular adenoma on colonoscopy in 2023.  Surveillance recommended in 7 years.  PLAN: - Obtain prior records from Dr. Collene Mares - See patient instructions for dietary and supplement recommendations - Discussed squatty potty - Trial of Linzess 145 mcg daily, may increase to 290 mcg daily after 1 week if needed - Referral to [facility] for PT - Surveillance colonoscopy 2030 - Follow-up as needed  HPI: Sydney [name] is a 51 y.o. female who presents today to discuss IBS.  I recently nostras Browder at the time of her colonoscopy 01/07/2022.  She has had hysteroscopic myomectomy of uterine fibroids in 2013 and 2017. She has chronic anemia attributed to the fibroids. She has previously seen by Dr. Olevia Perches, Dr. Collene Mares, and a [facility]. 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   She reports years of IBS that first started in her 84s. She normally has a bowel movement 2-3 times a week with associated abdominal pain and bloating that improves with defecation. She strains with defecation and has a sense of incomplete evacuation. She has a history of hemorrhoids associated with significant constipation. Rare rectal bleeding. No mucous in the stool.    After the colonoscopy, she temporarily was having a bowel movement daily but her bowel movements have more recently returned to normal.   Previously used pegylated interferon did not control her constipation, Amitiza resulted in urgent explosive watery bowel movements. She also finds TUMS and gas pills helpful. Fiber causes bloating.    Endoscopic history: -Colonoscopy with Dr. Collene Mares many years ago during the evaluation of IBS -Colonoscopy for colon cancer screening 01/07/2022 revealed a 4 mm descending  colon polyp with pathology results showing a tubular adenoma    Past Medical History:  Diagnosis Date   Abnormal bleeding in menstrual cycle    Anemia    Constipation    Fibroids    Headache(784.0)    otc med prn   Hemorrhoids    IBS (irritable bowel syndrome)    Raised intraocular pressure of both eyes    followed yearly by Dr. Mickie Hillier    Past Surgical History:  Procedure Laterality Date   COLONOSCOPY     HYSTEROSCOPY WITH D & C  10/14/2011   Procedure: DILATATION AND CURETTAGE /HYSTEROSCOPY;  Surgeon: Luz Lex, MD;  Location: [facility];  Service: Gynecology;  Laterality: N/A;  WITH TRUCLEAR morcellation   MYOMECTOMY  2013   x2   NM RENAL LASIX ([facility] HX) Bilateral    UPPER GASTROINTESTINAL ENDOSCOPY     UTERINE FIBROID SURGERY  08/13/2015   Dr. Corinna Capra   VASECTOMY     WISDOM TOOTH EXTRACTION        Current Outpatient Medications  Medication Sig Dispense Refill   B Complex-C (B-COMPLEX WITH VITAMIN C) tablet Take 1 tablet by mouth daily.     Cetirizine HCl (ZYRTEC ALLERGY) 10 MG CAPS Take by oral route.     ibuprofen (ADVIL,MOTRIN) 800 MG tablet Take 800 mg by mouth every 4 (four) hours as needed. For cramps-- pt says she does take every 4-6 hours if needed for major cramping     tranexamic acid (LYSTEDA) 650 MG TABS tablet Take 1,300 mg by mouth 3 (three) times daily as needed.     No  current facility-administered medications for this visit.    Allergies as of 03/18/2022   (No Known Allergies)    Family History  Problem Relation Age of Onset   Diabetes Mother    Hypertension Mother    Prostate cancer Paternal Uncle    Cancer Paternal Uncle    Heart disease Maternal Grandmother    Hypertension Paternal Grandmother    Stomach cancer Paternal Grandfather    Hypertension Paternal Grandfather    Cancer Paternal Grandfather    Colon cancer Neg Hx    Colon polyps Neg Hx    Esophageal cancer Neg Hx    Rectal cancer Neg Hx     Social History   Socioeconomic  History   Marital status: Married    Spouse name: Cornelia Copa   Number of children: 0   Years of education: Associates in [employer]   Highest education level: Not on file  Occupational History   Not on file  Tobacco Use   Smoking status: Never   Smokeless tobacco: Never  Vaping Use   Vaping Use: Never used  Substance and Sexual Activity   Alcohol use: Yes    Comment: wine - twice  a week   Drug use: No   Sexual activity: Yes    Birth control/protection: None  Other Topics Concern   Not on file  Social History Narrative   02/19/20   From: the area   Living: with husband, Cornelia Copa (2015)   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   Physical Exam: General:   Alert,  well-nourished, pleasant and cooperative in NAD Head:  Normocephalic and atraumatic. Eyes:  Sclera clear, no icterus.   Conjunctiva pink. Ears:  Normal auditory acuity. Nose:  No deformity, discharge,  or lesions. Mouth:  No deformity or lesions.   Neck:  Supple; no masses or thyromegaly. Lungs:  Clear throughout to auscultation.   No wheezes. Heart:  Regular rate and rhythm; no murmurs. Abdomen:  Soft, nontender, nondistended, normal bowel sounds, no rebound or guarding. No hepatosplenomegaly.   Rectal:  Deferred  Msk:  Symmetrical. No boney deformities LAD: No inguinal or umbilical  LAD Extremities:  No clubbing or edema. Neurologic:  Alert and  oriented x4;  grossly nonfocal Skin:  Intact without significant lesions or rashes. Psych:  Alert and cooperative. Normal mood and affect.    Lexander Tremblay L. Tarri Glenn, MD, MPH 03/18/2022, 10:57 AM

## 2022-03-21 ENCOUNTER — Encounter: Payer: Self-pay | Admitting: Gastroenterology

## 2022-03-22 ENCOUNTER — Encounter: Payer: Self-pay | Admitting: Family Medicine

## 2022-03-22 ENCOUNTER — Other Ambulatory Visit (HOSPITAL_COMMUNITY): Payer: Self-pay

## 2022-03-22 ENCOUNTER — Telehealth: Payer: Self-pay

## 2022-03-22 NOTE — Telephone Encounter (Signed)
Please submit for PA for Linzess 145 mcg

## 2022-03-22 NOTE — Telephone Encounter (Signed)
PA requirement notification received from patient.  PA started in [facility] and awaiting determination.  Key: Sydney [name]

## 2022-03-23 ENCOUNTER — Telehealth: Payer: Self-pay

## 2022-03-23 ENCOUNTER — Other Ambulatory Visit (HOSPITAL_COMMUNITY): Payer: Self-pay

## 2022-03-23 MED ORDER — TRULANCE 3 MG PO TABS: 3.0000 mg | ORAL_TABLET | Freq: Every day | ORAL | 3 refills | Status: DC

## 2022-03-23 NOTE — Telephone Encounter (Signed)
Pharmacy Patient Advocate Encounter  Received notification from [facility] that the request for prior authorization for Linzess 147mg has been denied due to      This determination is currently being reviewed for appeal.    AJoneen Boers CPHT Pharmacy Patient Advocate Specialist [place] Direct Number: [phone]: [phone]

## 2022-03-23 NOTE — Telephone Encounter (Signed)
Noted  

## 2022-03-23 NOTE — Telephone Encounter (Signed)
Pharmacy Patient Advocate Encounter  Prior Authorization for Trulance has been approved.    PA# [id number] Effective dates: 03/23/2022 through 03/22/2023      Patient Advocate Encounter   Received notification from [facility] that prior authorization for Trulance is required.   PA submitted on 03/23/2022 Key [id number] Status is pending      Joneen Boers, [place] Patient Advocate Specialist [place] Patient Advocate Team Direct Number: [phone] Fax: [phone]

## 2022-03-23 NOTE — Telephone Encounter (Signed)
Trulance sent to pharmacy. Please submit PA

## 2022-03-23 NOTE — Telephone Encounter (Signed)
Prior Sydney [name] has been approved. Pt has BCBS so may take up to 72 hours before able to process at pharmacy level.

## 2022-03-23 NOTE — Telephone Encounter (Signed)
Please advise 

## 2022-03-31 ENCOUNTER — Encounter: Payer: Self-pay | Admitting: Family Medicine

## 2022-03-31 ENCOUNTER — Ambulatory Visit: Payer: BC Managed Care – PPO | Admitting: Family Medicine

## 2022-03-31 ENCOUNTER — Telehealth: Payer: Self-pay | Admitting: Hematology and Oncology

## 2022-03-31 ENCOUNTER — Other Ambulatory Visit (HOSPITAL_COMMUNITY): Payer: Self-pay

## 2022-03-31 VITALS — BP 118/78 | HR 77 | Temp 97.5°F | Ht 62.0 in | Wt 189.4 lb

## 2022-03-31 DIAGNOSIS — Z91030 Bee allergy status: Secondary | ICD-10-CM

## 2022-03-31 DIAGNOSIS — R030 Elevated blood-pressure reading, without diagnosis of hypertension: Secondary | ICD-10-CM | POA: Diagnosis not present

## 2022-03-31 DIAGNOSIS — D500 Iron deficiency anemia secondary to blood loss (chronic): Secondary | ICD-10-CM

## 2022-03-31 LAB — CBC WITH DIFFERENTIAL/PLATELET
Basophils Absolute: 0 10*3/uL (ref 0.0–0.1)
Basophils Relative: 0.7 % (ref 0.0–3.0)
Eosinophils Absolute: 0.1 10*3/uL (ref 0.0–0.7)
Eosinophils Relative: 0.8 % (ref 0.0–5.0)
HCT: 37.6 % (ref 36.0–46.0)
Hemoglobin: 12.2 g/dL (ref 12.0–15.0)
Lymphocytes Relative: 34.1 % (ref 12.0–46.0)
Lymphs Abs: 2.3 10*3/uL (ref 0.7–4.0)
MCHC: 32.4 g/dL (ref 30.0–36.0)
MCV: 84.6 fl (ref 78.0–100.0)
Monocytes Absolute: 0.3 10*3/uL (ref 0.1–1.0)
Monocytes Relative: 4.9 % (ref 3.0–12.0)
Neutro Abs: 4.1 10*3/uL (ref 1.4–7.7)
Neutrophils Relative %: 59.5 % (ref 43.0–77.0)
Platelets: 300 10*3/uL (ref 150.0–400.0)
RBC: 4.44 Mil/uL (ref 3.87–5.11)
RDW: 15.9 % — ABNORMAL HIGH (ref 11.5–15.5)
WBC: 6.8 10*3/uL (ref 4.0–10.5)

## 2022-03-31 LAB — FERRITIN: Ferritin: 60.5 ng/mL (ref 10.0–291.0)

## 2022-03-31 MED ORDER — EPINEPHRINE 0.3 MG/0.3ML IJ SOAJ: 0.3000 mg | INTRAMUSCULAR | 0 refills | Status: AC | PRN

## 2022-03-31 NOTE — Progress Notes (Signed)
Assessment/Plan:   Problem List Items Addressed This Visit       Other   Iron deficiency anemia due to chronic blood loss - Primary   Relevant Orders   Iron and TIBC   Ferritin   CBC w/Diff   Ambulatory referral to Hematology / Oncology   Other Visit Diagnoses     Allergic to bees       Relevant Medications   EPINEPHrine (EPIPEN 2-PAK) 0.3 mg/0.3 mL IJ SOAJ injection   Elevated blood pressure reading without diagnosis of hypertension          For elevated BP.  We will have patient check home blood pressure and follow-up if elevated    Subjective:  HPI:  Sydney [name] is a 51 y.o. female who has Migraine; SLOW TRANSIT CONSTIPATION; IBS (irritable bowel syndrome); Vitamin D deficiency; Anemia; Perimenopausal; Ganglion cyst of dorsum of right wrist; Class 1 obesity with body mass index (BMI) of 33.0 to 33.9 in adult; and Iron deficiency anemia due to chronic blood loss on their problem list..   She  has a past medical history of Abnormal bleeding in menstrual cycle, Anemia, Constipation, Fibroids, Headache(784.0), Hemorrhoids, IBS (irritable bowel syndrome), and Raised intraocular pressure of both eyes..   She presents with chief complaint of Establish Care (Needs new PCP. B/p was elevated at previous appointment) .   Elevated blood pressure.  Patient with intermittent labile blood pressures.  She is concerned about them as multiple family members have it hypertension.  Patient is chronically short of breath, but this is likely due to her ongoing anemia.  She has no chest pain, headache, blurry vision. BP Readings from Last 3 Encounters:  03/31/22 118/78  03/18/22 (!) 154/102  01/23/22 131/88   IDA 2/2 fibroids.  Patient has ongoing AUB secondary to fibroids.  Patient is on TXA.  For anemia patient has been getting iron transfusions.  Last hemoglobin was 10.2.  Patient is followed with OB and is planning to get a hysterectomy in the coming months.  Patient  intolerant of oral iron due to IBS-C with constipation.  Bee sting allergy.  Patient's had multiple Hymenoptera stings over the past 6 months.  These have led to significant facial swelling.  She has been treated with steroids in the past.  She has no EpiPen.  Lab Results  Component Value Date   WBC 5.9 10/19/2021   HGB 10.2 (L) 10/19/2021   HCT 32.1 (L) 10/19/2021   MCV 78.9 10/19/2021   PLT 290.0 10/19/2021    Past Surgical History:  Procedure Laterality Date   COLONOSCOPY     HYSTEROSCOPY WITH D & C  10/14/2011   Procedure: DILATATION AND CURETTAGE /HYSTEROSCOPY;  Surgeon: Luz Lex, MD;  Location: [facility];  Service: Gynecology;  Laterality: N/A;  WITH TRUCLEAR morcellation   MYOMECTOMY  2013   x2   NM RENAL LASIX ([facility] HX) Bilateral    UPPER GASTROINTESTINAL ENDOSCOPY     UTERINE FIBROID SURGERY  08/13/2015   Dr. Corinna Capra    Outpatient Medications Prior to Visit  Medication Sig Dispense Refill   B Complex-C (B-COMPLEX WITH VITAMIN C) tablet Take 1 tablet by mouth daily.     Cetirizine HCl (ZYRTEC ALLERGY) 10 MG CAPS Take by oral route.     ibuprofen (ADVIL,MOTRIN) 800 MG tablet Take 800 mg by mouth every 4 (four) hours as needed. For cramps-- pt says she does take every 4-6 hours if needed for major cramping  Plecanatide (TRULANCE) 3 MG TABS Take 3 mg by mouth daily. 30 tablet 3   tranexamic acid (LYSTEDA) 650 MG TABS tablet Take 1,300 mg by mouth 3 (three) times daily as needed.     No facility-administered medications prior to visit.    Family History  Problem Relation Age of Onset   Diabetes Mother    Hypertension Mother    Prostate cancer Paternal Uncle    Cancer Paternal Uncle    Heart disease Maternal Grandmother    Hypertension Paternal Grandmother    Stomach cancer Paternal Grandfather    Hypertension Paternal Grandfather    Cancer Paternal Grandfather    Colon cancer Neg Hx    Colon polyps Neg Hx    Esophageal cancer Neg Hx    Rectal cancer Neg Hx      Social History   Socioeconomic History   Marital status: Married    Spouse name: Cornelia Copa   Number of children: 0   Years of education: [employer] in [employer]   Highest education level: Not on file  Occupational History   Not on file  Tobacco Use   Smoking status: Never    Passive exposure: Never   Smokeless tobacco: Never  Vaping Use   Vaping Use: Never used  Substance and Sexual Activity   Alcohol use: Yes    Comment: wine - twice  a week   Drug use: No   Sexual activity: Yes    Birth control/protection: None  Other Topics Concern   Not on file  Social History Narrative   02/19/20   From: the area   Living: with husband, Cornelia Copa (2015)   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, good relationship      Enjoys: travel, spend time with friends      Exercise: walking the block   Diet: meal prep at home - salads, grilled food, limits fried food      Safety   Seat belts: Yes    Guns: Yes  and secure   Safe in relationships: Yes    Social Determinants of Radio broadcast assistant Strain: Not on file  Food Insecurity: Not on file  Transportation Needs: Not on file  Physical Activity: Not on file  Stress: Not on file  Social Connections: Not on file  Intimate Partner Violence: Not on file                                                                                                 Objective:  Physical Exam: BP 118/78 (BP Location: Left Arm, Patient Position: Sitting, Cuff Size: Large)   Pulse 77   Temp (!) 97.5 F (36.4 C) (Temporal)   Ht '5\' 2"'$  (1.575 m)   Wt 189 lb 6.4 oz (85.9 kg)   LMP 03/12/2022   SpO2 97%   BMI 34.64 kg/m    General: No acute distress. Awake and conversant.  Eyes: Normal conjunctiva, anicteric. Round symmetric pupils.  ENT: Hearing grossly intact. No nasal discharge.  Neck: Neck is supple. No masses or thyromegaly.  Respiratory: Respirations are non-labored. No auditory wheezing.  CTA  B Skin: Warm. No rashes or ulcers.  Psych: Alert and oriented. Cooperative, Appropriate mood and affect, Normal judgment.  CV: No cyanosis or JVD, RRR, no MRG MSK: Normal ambulation. No clubbing, no lower extremity edema Neuro: Sensation and CN II-XII grossly normal.        Alesia Banda, MD, MS

## 2022-03-31 NOTE — Telephone Encounter (Signed)
Scheduled appointment per 11/02. Patient is aware of appointment date and time. Patient is aware to arrive 15 mins prior to appointment time and to bring updated insurance cards. Patient is aware of location.   

## 2022-03-31 NOTE — Patient Instructions (Signed)
For anemia, we are referring to hematology. We are checking iron levels.   For bee sting allergies, we are prescribing and epipen.   For blood pressure, check in first thing in the morning 3 times a week for a month. Follow up if average is > 140/90.

## 2022-04-01 LAB — IRON AND TIBC
Iron Saturation: 19 % (ref 15–55)
Iron: 75 ug/dL (ref 27–159)
Total Iron Binding Capacity: 393 ug/dL (ref 250–450)
UIBC: 318 ug/dL (ref 131–425)

## 2022-04-12 NOTE — Progress Notes (Deleted)
Sydney [name] NOTE  Patient Care Team: Bonnita Hollow, MD as PCP - General (Family Medicine)  ASSESSMENT & PLAN:  No problem-specific Assessment & Plan notes found for this encounter.  No orders of the defined types were placed in this encounter.   All questions were answered. The patient knows to call the clinic with any problems, questions or concerns.  The total time spent in the appointment was {CHL ONC TIME VISIT - IZTIW:[id number]} encounter with patients including review of chart and various tests results, discussions about plan of care and coordination of care plan  Sydney Lark, MD 11/14/20231:34 PM   CHIEF COMPLAINTS/PURPOSE OF CONSULTATION:  Anemia  HISTORY OF PRESENTING ILLNESS:  Sydney [name] 51 y.o. female is here because of anemia  ***She was found to have abnormal CBC from *** ***She denies recent chest pain on exertion, shortness of breath on minimal exertion, pre-syncopal episodes, or palpitations. ***She had not noticed any recent bleeding such as epistaxis, hematuria or hematochezia ***The patient denies over the counter NSAID ingestion. She is not *** on antiplatelets agents. Her last colonoscopy was on January 07, 2022 which showed no evidence of malignancy or source of bleeding ***She had no prior history or diagnosis of cancer. Her age appropriate screening programs are up-to-date. ***She denies any pica and eats a variety of diet. ***She never donated blood or received blood transfusion She has received 4 doses of intravenous iron sucrose from 12/22/2021 to 12/31/2021 ***The patient was prescribed oral iron supplements and she takes ***  MEDICAL HISTORY:  Past Medical History:  Diagnosis Date   Abnormal bleeding in menstrual cycle    Anemia    Constipation    Fibroids    Headache(784.0)    otc med prn   Hemorrhoids    IBS (irritable bowel syndrome)    Raised intraocular pressure of both eyes    followed yearly by  Dr. Mickie Hillier    SURGICAL HISTORY: Past Surgical History:  Procedure Laterality Date   COLONOSCOPY     HYSTEROSCOPY WITH D & C  10/14/2011   Procedure: DILATATION AND CURETTAGE /HYSTEROSCOPY;  Surgeon: Luz Lex, MD;  Location: [facility];  Service: Gynecology;  Laterality: N/A;  WITH TRUCLEAR morcellation   MYOMECTOMY  2013   x2   NM RENAL LASIX ([facility] HX) Bilateral    UPPER GASTROINTESTINAL ENDOSCOPY     UTERINE FIBROID SURGERY  08/13/2015   Dr. Corinna Capra    SOCIAL HISTORY: Social History   Socioeconomic History   Marital status: Married    Spouse name: Cornelia Copa   Number of children: 0   Years of education: [employer]   Highest education level: Not on file  Occupational History   Not on file  Tobacco Use   Smoking status: Never    Passive exposure: Never   Smokeless tobacco: Never  Vaping Use   Vaping Use: Never used  Substance and Sexual Activity   Alcohol use: Yes    Comment: wine - twice  a week   Drug use: No   Sexual activity: Yes    Birth control/protection: None  Other Topics Concern   Not on file  Social History Narrative   02/19/20   From: the area   Living: with husband, Cornelia Copa (2015)   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, good relationship      Enjoys: travel, spend time with friends      Exercise: walking  the block   Diet: meal prep at home - salads, grilled food, limits fried food      Safety   Seat belts: Yes    Guns: Yes  and secure   Safe in relationships: Yes    Social Determinants of Health   Financial Resource Strain: Not on file  Food Insecurity: Not on file  Transportation Needs: Not on file  Physical Activity: Not on file  Stress: Not on file  Social Connections: Not on file  Intimate Partner Violence: Not on file    FAMILY HISTORY: Family History  Problem Relation Age of Onset   Diabetes Mother    Hypertension Mother    Prostate cancer Paternal Uncle    Cancer Paternal Uncle     Heart disease Maternal Grandmother    Hypertension Paternal Grandmother    Stomach cancer Paternal Grandfather    Hypertension Paternal Grandfather    Cancer Paternal Grandfather    Colon cancer Neg Hx    Colon polyps Neg Hx    Esophageal cancer Neg Hx    Rectal cancer Neg Hx     ALLERGIES:  is allergic to hymenoptera venom preparations.  MEDICATIONS:  Current Outpatient Medications  Medication Sig Dispense Refill   B Complex-C (B-COMPLEX WITH VITAMIN C) tablet Take 1 tablet by mouth daily.     Cetirizine HCl (ZYRTEC ALLERGY) 10 MG CAPS Take by oral route.     EPINEPHrine (EPIPEN 2-PAK) 0.3 mg/0.3 mL IJ SOAJ injection Inject 0.3 mg into the muscle as needed for anaphylaxis. 1 each 0   ibuprofen (ADVIL,MOTRIN) 800 MG tablet Take 800 mg by mouth every 4 (four) hours as needed. For cramps-- pt says she does take every 4-6 hours if needed for major cramping     Plecanatide (TRULANCE) 3 MG TABS Take 3 mg by mouth daily. 30 tablet 3   tranexamic acid (LYSTEDA) 650 MG TABS tablet Take 1,300 mg by mouth 3 (three) times daily as needed.     No current facility-administered medications for this visit.    REVIEW OF SYSTEMS:   Constitutional: Denies fevers, chills or abnormal night sweats Eyes: Denies blurriness of vision, double vision or watery eyes Ears, nose, mouth, throat, and face: Denies mucositis or sore throat Respiratory: Denies cough, dyspnea or wheezes Cardiovascular: Denies palpitation, chest discomfort or lower extremity swelling Gastrointestinal:  Denies nausea, heartburn or change in bowel habits Skin: Denies abnormal skin rashes Lymphatics: Denies new lymphadenopathy or easy bruising Neurological:Denies numbness, tingling or new weaknesses Behavioral/Psych: Mood is stable, no new changes  All other systems were reviewed with the patient and are negative.  PHYSICAL EXAMINATION: ECOG PERFORMANCE STATUS: {CHL ONC ECOG PS:[id number]}  There were no vitals filed for this  visit. There were no vitals filed for this visit.  GENERAL:alert, no distress and comfortable SKIN: skin color, texture, turgor are normal, no rashes or significant lesions EYES: normal, conjunctiva are pink and non-injected, sclera clear OROPHARYNX:no exudate, no erythema and lips, buccal mucosa, and tongue normal  NECK: supple, thyroid normal size, non-tender, without nodularity LYMPH:  no palpable lymphadenopathy in the cervical, axillary or inguinal LUNGS: clear to auscultation and percussion with normal breathing effort HEART: regular rate & rhythm and no murmurs and no lower extremity edema ABDOMEN:abdomen soft, non-tender and normal bowel sounds Musculoskeletal:no cyanosis of digits and no clubbing  PSYCH: alert & oriented x 3 with fluent speech NEURO: no focal motor/sensory deficits  RADIOGRAPHIC STUDIES: I have personally reviewed the radiological images as listed and agreed  with the findings in the report. No results found.

## 2022-04-18 ENCOUNTER — Inpatient Hospital Stay: Payer: BC Managed Care – PPO | Admitting: Hematology and Oncology

## 2022-07-01 ENCOUNTER — Ambulatory Visit: Payer: BC Managed Care – PPO | Admitting: Family Medicine

## 2022-07-01 ENCOUNTER — Encounter: Payer: Self-pay | Admitting: Family Medicine

## 2022-07-01 VITALS — BP 128/82 | HR 79 | Temp 97.4°F | Wt 194.2 lb

## 2022-07-01 DIAGNOSIS — M25561 Pain in right knee: Secondary | ICD-10-CM | POA: Diagnosis not present

## 2022-07-01 DIAGNOSIS — G8929 Other chronic pain: Secondary | ICD-10-CM | POA: Insufficient documentation

## 2022-07-01 MED ORDER — DICLOFENAC SODIUM 1 % EX GEL: 4.0000 g | Freq: Four times a day (QID) | CUTANEOUS | 3 refills | Status: DC | PRN

## 2022-07-01 NOTE — Progress Notes (Signed)
Assessment/Plan:   Problem List Items Addressed This Visit       Other   Chronic pain of right knee - Primary    The patient has a several-month history of intermittent, non-traumatic right knee pain that is responsive to movement and compression sleeve use. Symptomatology suggests mechanical and possibly inflammatory elements contributing to discomfort.  Differential diagnosis:  Degenerative joint disease/Osteoarthritis (M17.11) Prepatellar bursitis (M70.41) Meniscal tear (M23.2-) Patellofemoral pain syndrome (M22.2)  Plan:  Continue the use of compression sleeve as needed. Reinforce proper positioning and adjustment to maximize therapeutic benefits. Recommendation to obtain an X-ray of the right knee at a sister facility for further evaluation of bone and joint health. Try topical NSAID treatment, such as diclofenac gel, for localized anti-inflammatory effect and symptom relief. Home exercises, specifically gentle knee stretches and strengthening exercises, to improve knee stability and function. Reassess symptoms after completion of the above interventions; if there is no significant improvement, consider referral to Sports Medicine for potential ultrasound assessment or orthopedics for advanced evaluation and management. Plan to discuss remaining concerns, if any, at the scheduled physical in May      Relevant Medications   diclofenac Sodium (VOLTAREN) 1 % GEL   Other Relevant Orders   DG Knee 4 Views W/Patella Right (sunrise view, tunnel view)    There are no discontinued medications.    Subjective:  HPI: Encounter date: 07/01/2022  Sydney [name] is a 52 y.o. female who has Migraine; SLOW TRANSIT CONSTIPATION; IBS (irritable bowel syndrome); Vitamin D deficiency; Anemia; Perimenopausal; Ganglion cyst of dorsum of right wrist; Class 1 obesity with body mass index (BMI) of 33.0 to 33.9 in adult; Iron deficiency anemia due to chronic blood loss; and Chronic  pain of right knee on their problem list..   She  has a past medical history of Abnormal bleeding in menstrual cycle, Anemia, Constipation, Fibroids, Headache(784.0), Hemorrhoids, IBS (irritable bowel syndrome), and Raised intraocular pressure of both eyes..   CHIEF COMPLAINT: Patient presents with chronic right knee pain lasting for several months with intermittent intensity and associated discomfort in the back of the knee and occasional referred pain to the ankle.  HISTORY OF PRESENT ILLNESS:  Problem 1: The patient reports a history of chronic right knee pain which has been persistent for several months. The patient describes the pain as "more back radiating" with occasional episodes of severe intensity that impedes weight-bearing on the affected leg. Pain improves with movement and compression sleeve use. The patient denies any known injuries but does recall being on her knees for a prolonged period during household activities. The patient has tried heat and cold therapy with uncertain effectiveness. The pain tends to be worse when arising from a seated position and occasionally presents as an aching sensation when in bed. The patient has self-managed with an 800 mg ibuprofen as needed, particularly for menstrual cramps related to a separate medical condition (IBS), but has not noticed definitive relief of knee symptoms with ibuprofen use.  Past Surgical History:  Procedure Laterality Date   COLONOSCOPY     HYSTEROSCOPY WITH D & C  10/14/2011   Procedure: DILATATION AND CURETTAGE /HYSTEROSCOPY;  Surgeon: Luz Lex, MD;  Location: [facility];  Service: Gynecology;  Laterality: N/A;  WITH TRUCLEAR morcellation   MYOMECTOMY  2013   x2   NM RENAL LASIX ([facility] HX) Bilateral    UPPER GASTROINTESTINAL ENDOSCOPY     UTERINE FIBROID SURGERY  08/13/2015   Dr. Corinna Capra    Outpatient  Medications Prior to Visit  Medication Sig Dispense Refill   B Complex-C (B-COMPLEX WITH VITAMIN C) tablet Take 1 tablet  by mouth daily.     Cetirizine HCl (ZYRTEC ALLERGY) 10 MG CAPS Take by oral route.     ibuprofen (ADVIL,MOTRIN) 800 MG tablet Take 800 mg by mouth every 4 (four) hours as needed. For cramps-- pt says she does take every 4-6 hours if needed for major cramping     Plecanatide (TRULANCE) 3 MG TABS Take 3 mg by mouth daily. 30 tablet 3   tranexamic acid (LYSTEDA) 650 MG TABS tablet Take 1,300 mg by mouth 3 (three) times daily as needed.     No facility-administered medications prior to visit.    Family History  Problem Relation Age of Onset   Diabetes Mother    Hypertension Mother    Prostate cancer Paternal Uncle    Cancer Paternal Uncle    Heart disease Maternal Grandmother    Hypertension Paternal Grandmother    Stomach cancer Paternal Grandfather    Hypertension Paternal Grandfather    Cancer Paternal Grandfather    Colon cancer Neg Hx    Colon polyps Neg Hx    Esophageal cancer Neg Hx    Rectal cancer Neg Hx     Social History   Socioeconomic History   Marital status: Married    Spouse name: Cornelia Copa   Number of children: 0   Years of education: [employer]   Highest education level: Not on file  Occupational History   Not on file  Tobacco Use   Smoking status: Never    Passive exposure: Never   Smokeless tobacco: Never  Vaping Use   Vaping Use: Never used  Substance and Sexual Activity   Alcohol use: Yes    Comment: wine - twice  a week   Drug use: No   Sexual activity: Yes    Birth control/protection: None  Other Topics Concern   Not on file  Social History Narrative   02/19/20   From: the area   Living: with husband, Cornelia Copa (2015)   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, good relationship      Enjoys: travel, spend time with friends      Exercise: walking the block   Diet: meal prep at home - salads, grilled food, limits fried food      Safety   Seat belts: Yes    Guns: Yes  and secure   Safe in  relationships: Yes    Social Determinants of Radio broadcast assistant Strain: Not on file  Food Insecurity: Not on file  Transportation Needs: Not on file  Physical Activity: Not on file  Stress: Not on file  Social Connections: Not on file  Intimate Partner Violence: Not on file                                                                                                 Objective:  Physical Exam: BP 128/82 (BP Location: Right Arm, Patient Position: Sitting, Cuff Size:  Large)   Pulse 79   Temp (!) 97.4 F (36.3 C) (Temporal)   Wt 194 lb 3.2 oz (88.1 kg)   LMP 06/19/2022   SpO2 98%   BMI 35.52 kg/m    General: No acute distress. Awake and conversant.  Eyes: Normal conjunctiva, anicteric. Round symmetric pupils.  ENT: Hearing grossly intact. No nasal discharge.  Neck: Neck is supple. No masses or thyromegaly.  Respiratory: Respirations are non-labored. No auditory wheezing.  Skin: Warm. No rashes or ulcers.  Psych: Alert and oriented. Cooperative, Appropriate mood and affect, Normal judgment.  CV: No cyanosis or JVD MSK: Normal ambulation. No clubbing. Knee examination showed no significant swelling, no point tenderness upon palpation, and the knee appeared stable with normal ligament tests.  Neuro: Sensation and CN II-XII grossly normal.        Alesia Banda, MD, MS

## 2022-07-01 NOTE — Assessment & Plan Note (Signed)
The patient has a several-month history of intermittent, non-traumatic right knee pain that is responsive to movement and compression sleeve use. Symptomatology suggests mechanical and possibly inflammatory elements contributing to discomfort.  Differential diagnosis:  Degenerative joint disease/Osteoarthritis (M17.11) Prepatellar bursitis (M70.41) Meniscal tear (M23.2-) Patellofemoral pain syndrome (M22.2)  Plan:  Continue the use of compression sleeve as needed. Reinforce proper positioning and adjustment to maximize therapeutic benefits. Recommendation to obtain an X-ray of the right knee at a sister facility for further evaluation of bone and joint health. Try topical NSAID treatment, such as diclofenac gel, for localized anti-inflammatory effect and symptom relief. Home exercises, specifically gentle knee stretches and strengthening exercises, to improve knee stability and function. Reassess symptoms after completion of the above interventions; if there is no significant improvement, consider referral to Sports Medicine for potential ultrasound assessment or orthopedics for advanced evaluation and management. Plan to discuss remaining concerns, if any, at the scheduled physical in May

## 2022-10-04 ENCOUNTER — Ambulatory Visit: Payer: BC Managed Care – PPO | Admitting: Internal Medicine

## 2022-10-04 ENCOUNTER — Encounter: Payer: Self-pay | Admitting: Internal Medicine

## 2022-10-04 VITALS — BP 130/82 | HR 91 | Temp 97.7°F | Resp 18 | Ht 62.0 in | Wt 194.2 lb

## 2022-10-04 DIAGNOSIS — K581 Irritable bowel syndrome with constipation: Secondary | ICD-10-CM | POA: Diagnosis not present

## 2022-10-04 DIAGNOSIS — E669 Obesity, unspecified: Secondary | ICD-10-CM

## 2022-10-04 DIAGNOSIS — D219 Benign neoplasm of connective and other soft tissue, unspecified: Secondary | ICD-10-CM

## 2022-10-04 DIAGNOSIS — R7303 Prediabetes: Secondary | ICD-10-CM | POA: Diagnosis not present

## 2022-10-04 DIAGNOSIS — Z9109 Other allergy status, other than to drugs and biological substances: Secondary | ICD-10-CM | POA: Diagnosis not present

## 2022-10-04 DIAGNOSIS — D500 Iron deficiency anemia secondary to blood loss (chronic): Secondary | ICD-10-CM

## 2022-10-04 LAB — POCT GLYCOSYLATED HEMOGLOBIN (HGB A1C): Hemoglobin A1C: 5.7 % — AB (ref 4.0–5.6)

## 2022-10-04 MED ORDER — ZEPBOUND 2.5 MG/0.5ML ~~LOC~~ SOAJ: 2.5000 mg | SUBCUTANEOUS | 0 refills | Status: DC

## 2022-10-04 MED ORDER — TRULANCE 3 MG PO TABS: 3.0000 mg | ORAL_TABLET | Freq: Every day | ORAL | 1 refills | Status: DC

## 2022-10-04 NOTE — Progress Notes (Signed)
New Patient Office Visit  Subjective    Patient ID: Sydney [name], female    DOB: Dec 20, 1970  Age: 52 y.o. MRN: [id number]  CC:  Chief Complaint  Patient presents with   Establish Care   Obesity    HPI Sydney [name] presents to establish care. Wants to discuss weight loss today.  Environmental Allergies: -Currently on Zyrtec 10 mg as needed   History of Iron Deficiency Anemia/Fibroids: -Labs checked 11/23 ferritin 60.5, hgb 12.2 -Had to have iron infusions in the past -Does have irregular periods, last period was in March, planning possible hysterectomy later this year due to fibroids -Follows with Gynecology   IBS-Constipation: -Takes Senna as needed  -Normally has a BM ever 2-3 days, used to take Trulance PRN  Health Maintenance: -Blood work UTD -Mammogram due -Colon cancer screening: colonoscopy 8/23, repeat in 7 years  -Pap 2021, due, follows with Gynecology   Outpatient Encounter Medications as of 10/04/2022  Medication Sig   Cetirizine HCl (ZYRTEC ALLERGY) 10 MG CAPS Take by oral route.   Multiple Vitamin (MULTIVITAMIN) tablet Take 1 tablet by mouth daily.   [DISCONTINUED] B Complex-C (B-COMPLEX WITH VITAMIN C) tablet Take 1 tablet by mouth daily.   [DISCONTINUED] diclofenac Sodium (VOLTAREN) 1 % GEL Apply 4 g topically 4 (four) times daily as needed.   [DISCONTINUED] ibuprofen (ADVIL,MOTRIN) 800 MG tablet Take 800 mg by mouth every 4 (four) hours as needed. For cramps-- pt says she does take every 4-6 hours if needed for major cramping   [DISCONTINUED] Plecanatide (TRULANCE) 3 MG TABS Take 3 mg by mouth daily.   [DISCONTINUED] tranexamic acid (LYSTEDA) 650 MG TABS tablet Take 1,300 mg by mouth 3 (three) times daily as needed.   No facility-administered encounter medications on file as of 10/04/2022.    Past Medical History:  Diagnosis Date   Abnormal bleeding in menstrual cycle    Anemia    Fibroids    IBS (irritable bowel syndrome)      Past Surgical History:  Procedure Laterality Date   COLONOSCOPY     HYSTEROSCOPY WITH D & C  10/14/2011   Procedure: DILATATION AND CURETTAGE /HYSTEROSCOPY;  Surgeon: Turner Daniels, MD;  Location: [facility];  Service: Gynecology;  Laterality: N/A;  WITH TRUCLEAR morcellation   MYOMECTOMY  2013   x2   NM RENAL LASIX ([facility] HX) Bilateral    UPPER GASTROINTESTINAL ENDOSCOPY     UTERINE FIBROID SURGERY  08/13/2015   Dr. Rana Snare    Family History  Problem Relation Age of Onset   Diabetes Mother    Hypertension Mother    Prostate cancer Paternal Uncle    Cancer Paternal Uncle    Heart disease Maternal Grandmother    Hypertension Paternal Grandmother    Stomach cancer Paternal Grandfather    Hypertension Paternal Grandfather    Cancer Paternal Grandfather    Colon cancer Neg Hx    Colon polyps Neg Hx    Esophageal cancer Neg Hx    Rectal cancer Neg Hx     Social History   Socioeconomic History   Marital status: Married    Spouse name: Dennard Nip   Number of children: 0   Years of education: [employer]   Highest education level: Not on file  Occupational History   Not on file  Tobacco Use   Smoking status: Never    Passive exposure: Never   Smokeless tobacco: Never  Vaping Use   Vaping Use: Never used  Substance and Sexual Activity   Alcohol use: Yes    Comment: wine - twice  a week   Drug use: No   Sexual activity: Yes    Birth control/protection: None  Other Topics Concern   Not on file  Social History Narrative   02/19/20   From: the area   Living: with husband, Dennard Nip (2015)   Work: Buyer, retail at [employer]      Family: mom is nearby, good relationship      Enjoys: travel, spend time with friends      Exercise: walking the block   Diet: meal prep at home - salads, grilled food, limits fried food      Safety   Seat belts: Yes    Guns: Yes  and secure   Safe in relationships: Yes    Social Determinants of Research scientist (physical sciences) Strain: Not on file  Food Insecurity: Not on file  Transportation Needs: Not on file  Physical Activity: Not on file  Stress: Not on file  Social Connections: Not on file  Intimate Partner Violence: Not on file    Review of Systems  All other systems reviewed and are negative.       Objective    BP 130/82   Pulse 91   Temp 97.7 F (36.5 C)   Resp 18   Ht 5\' 2"  (1.575 m)   Wt 194 lb 3.2 oz (88.1 kg)   LMP 09/12/2022   SpO2 100%   BMI 35.52 kg/m   Physical Exam Constitutional:      Appearance: Normal appearance. She is obese.  HENT:     Head: Normocephalic and atraumatic.     Mouth/Throat:     Mouth: Mucous membranes are moist.     Pharynx: Oropharynx is clear.  Eyes:     Extraocular Movements: Extraocular movements intact.     Conjunctiva/sclera: Conjunctivae normal.     Pupils: Pupils are equal, round, and reactive to light.  Cardiovascular:     Rate and Rhythm: Normal rate and regular rhythm.  Pulmonary:     Effort: Pulmonary effort is normal.     Breath sounds: Normal breath sounds.  Musculoskeletal:     Right lower leg: No edema.     Left lower leg: No edema.  Skin:    General: Skin is warm and dry.  Neurological:     General: No focal deficit present.     Mental Status: She is alert. Mental status is at baseline.  Psychiatric:        Mood and Affect: Mood normal.        Behavior: Behavior normal.         Assessment & Plan:   1. Obesity (BMI 35.0-39.9 without comorbidity)/Prediabetes: A1c in the office 5.7%. Will prescribe Zepbound 2.5 mg weekly for weight loss.  - POCT HgB A1C - tirzepatide (ZEPBOUND) 2.5 MG/0.5ML Pen; Inject 2.5 mg into the skin once a week.  Dispense: 2 mL; Refill: 0  2. Irritable bowel syndrome with constipation: Uses Senna as needed, will refill Trulance to also use as needed.  - Plecanatide (TRULANCE) 3 MG TABS; Take 1 tablet (3 mg total) by mouth daily.  Dispense: 30 tablet; Refill: 1  3. Environmental  allergies: Stable, takes Zyrtec PRN.  4. Iron deficiency anemia due to chronic blood loss/Fibroids: Reviewed labs from November, anemia resolved. Following with Gynecology, discussing potential hysterectomy.    Return in about 4 weeks (around 11/01/2022) for CPE .  Teodora Medici, DO

## 2022-10-05 ENCOUNTER — Telehealth: Payer: Self-pay

## 2022-10-05 NOTE — Telephone Encounter (Signed)
Prior Auth Started on 10/05/22 waiting on outcome

## 2022-10-06 NOTE — Telephone Encounter (Signed)
Additional questions sent in today 10/06/22

## 2022-10-10 NOTE — Telephone Encounter (Signed)
Approved, will scan in chart

## 2022-10-12 LAB — HM PAP SMEAR: HM Pap smear: NEGATIVE

## 2022-11-01 NOTE — Progress Notes (Unsigned)
Established Patient Office Visit  Subjective    Patient ID: Sydney [name], female    DOB: 1971-01-14  Age: 52 y.o. MRN: [id number]  CC:  No chief complaint on file.   HPI Sydney [name] presents to follow up.   Environmental Allergies: -Currently on Zyrtec 10 mg as needed   History of Iron Deficiency Anemia/Fibroids: -Labs checked 11/23 ferritin 60.5, hgb 12.2 -Had to have iron infusions in the past -Does have irregular periods, last period was in March, planning possible hysterectomy later this year due to fibroids -Follows with Gynecology   IBS-Constipation: -Takes Senna as needed  -Normally has a BM ever 2-3 days, used to take Trulance PRN  Health Maintenance: -Blood work UTD -Mammogram due -Colon cancer screening: colonoscopy 8/23, repeat in 7 years  -Pap 2021, due, follows with Gynecology   Outpatient Encounter Medications as of 11/03/2022  Medication Sig   Cetirizine HCl (ZYRTEC ALLERGY) 10 MG CAPS Take by oral route.   Multiple Vitamin (MULTIVITAMIN) tablet Take 1 tablet by mouth daily.   Plecanatide (TRULANCE) 3 MG TABS Take 1 tablet (3 mg total) by mouth daily.   tirzepatide (ZEPBOUND) 2.5 MG/0.5ML Pen Inject 2.5 mg into the skin once a week.   No facility-administered encounter medications on file as of 11/03/2022.    Past Medical History:  Diagnosis Date   Abnormal bleeding in menstrual cycle    Anemia    Fibroids    IBS (irritable bowel syndrome)     Past Surgical History:  Procedure Laterality Date   COLONOSCOPY     HYSTEROSCOPY WITH D & C  10/14/2011   Procedure: DILATATION AND CURETTAGE /HYSTEROSCOPY;  Surgeon: Turner Daniels, MD;  Location: [facility];  Service: Gynecology;  Laterality: N/A;  WITH TRUCLEAR morcellation   MYOMECTOMY  2013   x2   NM RENAL LASIX ([facility] HX) Bilateral    UPPER GASTROINTESTINAL ENDOSCOPY     UTERINE FIBROID SURGERY  08/13/2015   Dr. Rana Snare    Family History  Problem Relation Age of Onset   Diabetes  Mother    Hypertension Mother    Prostate cancer Paternal Uncle    Cancer Paternal Uncle    Heart disease Maternal Grandmother    Hypertension Paternal Grandmother    Stomach cancer Paternal Grandfather    Hypertension Paternal Grandfather    Cancer Paternal Grandfather    Colon cancer Neg Hx    Colon polyps Neg Hx    Esophageal cancer Neg Hx    Rectal cancer Neg Hx     Social History   Socioeconomic History   Marital status: Married    Spouse name: Sydney [name]   Number of children: 0   Years of education: [employer] in [employer]   Highest education level: Not on file  Occupational History   Not on file  Tobacco Use   Smoking status: Never    Passive exposure: Never   Smokeless tobacco: Never  Vaping Use   Vaping Use: Never used  Substance and Sexual Activity   Alcohol use: Yes    Comment: wine - twice  a week   Drug use: No   Sexual activity: Yes    Birth control/protection: None  Other Topics Concern   Not on file  Social History Narrative   02/19/20   From: the area   Living: with husband, Sydney [name] (2015)   Work: Buyer, retail at [employer]      Family: mom is nearby, good relationship  Enjoys: travel, spend time with friends      Exercise: walking the block   Diet: meal prep at home - salads, grilled food, limits fried food      Safety   Seat belts: Yes    Guns: Yes  and secure   Safe in relationships: Yes    Social Determinants of Health   Financial Resource Strain: Not on file  Food Insecurity: Not on file  Transportation Needs: Not on file  Physical Activity: Not on file  Stress: Not on file  Social Connections: Not on file  Intimate Partner Violence: Not on file    Review of Systems  All other systems reviewed and are negative.       Objective    LMP 09/12/2022   Physical Exam Constitutional:      Appearance: Normal appearance. She is obese.  HENT:     Head: Normocephalic and atraumatic.     Mouth/Throat:     Mouth:  Mucous membranes are moist.     Pharynx: Oropharynx is clear.  Eyes:     Extraocular Movements: Extraocular movements intact.     Conjunctiva/sclera: Conjunctivae normal.     Pupils: Pupils are equal, round, and reactive to light.  Cardiovascular:     Rate and Rhythm: Normal rate and regular rhythm.  Pulmonary:     Effort: Pulmonary effort is normal.     Breath sounds: Normal breath sounds.  Musculoskeletal:     Right lower leg: No edema.     Left lower leg: No edema.  Skin:    General: Skin is warm and dry.  Neurological:     General: No focal deficit present.     Mental Status: She is alert. Mental status is at baseline.  Psychiatric:        Mood and Affect: Mood normal.        Behavior: Behavior normal.         Assessment & Plan:   1. Obesity (BMI 35.0-39.9 without comorbidity)/Prediabetes: A1c in the office 5.7%. Will prescribe Zepbound 2.5 mg weekly for weight loss.  - POCT HgB A1C - tirzepatide (ZEPBOUND) 2.5 MG/0.5ML Pen; Inject 2.5 mg into the skin once a week.  Dispense: 2 mL; Refill: 0  2. Irritable bowel syndrome with constipation: Uses Senna as needed, will refill Trulance to also use as needed.  - Plecanatide (TRULANCE) 3 MG TABS; Take 1 tablet (3 mg total) by mouth daily.  Dispense: 30 tablet; Refill: 1  3. Environmental allergies: Stable, takes Zyrtec PRN.  4. Iron deficiency anemia due to chronic blood loss/Fibroids: Reviewed labs from November, anemia resolved. Following with Gynecology, discussing potential hysterectomy.    No follow-ups on file.   Sydney Mail, DO

## 2022-11-03 ENCOUNTER — Other Ambulatory Visit: Payer: Self-pay

## 2022-11-03 ENCOUNTER — Ambulatory Visit (INDEPENDENT_AMBULATORY_CARE_PROVIDER_SITE_OTHER): Payer: BC Managed Care – PPO | Admitting: Internal Medicine

## 2022-11-03 ENCOUNTER — Encounter: Payer: Self-pay | Admitting: Internal Medicine

## 2022-11-03 VITALS — BP 118/72 | HR 86 | Temp 97.8°F | Resp 16 | Wt 186.7 lb

## 2022-11-03 DIAGNOSIS — Z1159 Encounter for screening for other viral diseases: Secondary | ICD-10-CM

## 2022-11-03 DIAGNOSIS — Z0000 Encounter for general adult medical examination without abnormal findings: Secondary | ICD-10-CM | POA: Diagnosis not present

## 2022-11-03 DIAGNOSIS — R7303 Prediabetes: Secondary | ICD-10-CM | POA: Diagnosis not present

## 2022-11-03 DIAGNOSIS — D508 Other iron deficiency anemias: Secondary | ICD-10-CM

## 2022-11-03 DIAGNOSIS — Z13220 Encounter for screening for lipoid disorders: Secondary | ICD-10-CM

## 2022-11-03 DIAGNOSIS — E669 Obesity, unspecified: Secondary | ICD-10-CM | POA: Diagnosis not present

## 2022-11-03 LAB — CBC WITH DIFFERENTIAL/PLATELET
Eosinophils Relative: 1.2 %
Lymphs Abs: 2019 cells/uL (ref 850–3900)
MCHC: 30.5 g/dL — ABNORMAL LOW (ref 32.0–36.0)
MPV: 10.6 fL (ref 7.5–12.5)
Neutrophils Relative %: 51.3 %
Platelets: 263 10*3/uL (ref 140–400)
Total Lymphocyte: 41.2 %

## 2022-11-03 MED ORDER — TIRZEPATIDE 5 MG/0.5ML ~~LOC~~ SOAJ
5.0000 mg | SUBCUTANEOUS | 0 refills | Status: DC
Start: 2022-11-03 — End: 2022-12-05

## 2022-11-04 ENCOUNTER — Telehealth: Payer: Self-pay

## 2022-11-04 LAB — LIPID PANEL
Cholesterol: 179 mg/dL (ref <200)
HDL: 86 mg/dL (ref >=50)
LDL Cholesterol (Calc): 80 mg/dL (calc)
Non-HDL Cholesterol (Calc): 93 mg/dL (calc) (ref <130)
Total CHOL/HDL Ratio: 2.1 (calc) (ref <5.0)
Triglycerides: 57 mg/dL (ref <150)

## 2022-11-04 LAB — CBC WITH DIFFERENTIAL/PLATELET
Absolute Monocytes: 289 cells/uL (ref 200–950)
Basophils Absolute: 20 cells/uL (ref 0–200)
Basophils Relative: 0.4 %
Eosinophils Absolute: 59 cells/uL (ref 15–500)
HCT: 38 % (ref 35.0–45.0)
Hemoglobin: 11.6 g/dL — ABNORMAL LOW (ref 11.7–15.5)
MCH: 24.7 pg — ABNORMAL LOW (ref 27.0–33.0)
MCV: 81 fL (ref 80.0–100.0)
Monocytes Relative: 5.9 %
Neutro Abs: 2514 cells/uL (ref 1500–7800)
RBC: 4.69 10*6/uL (ref 3.80–5.10)
RDW: 14.8 % (ref 11.0–15.0)
WBC: 4.9 10*3/uL (ref 3.8–10.8)

## 2022-11-04 LAB — COMPLETE METABOLIC PANEL WITH GFR
AG Ratio: 1.4 (calc) (ref 1.0–2.5)
ALT: 16 U/L (ref 6–29)
AST: 20 U/L (ref 10–35)
Albumin: 4.2 g/dL (ref 3.6–5.1)
Alkaline phosphatase (APISO): 97 U/L (ref 37–153)
BUN/Creatinine Ratio: 14 (calc) (ref 6–22)
BUN: 15 mg/dL (ref 7–25)
CO2: 24 mmol/L (ref 20–32)
Calcium: 9.6 mg/dL (ref 8.6–10.4)
Chloride: 108 mmol/L (ref 98–110)
Creat: 1.04 mg/dL — ABNORMAL HIGH (ref 0.50–1.03)
Globulin: 3 g/dL (calc) (ref 1.9–3.7)
Glucose, Bld: 86 mg/dL (ref 65–99)
Potassium: 4.3 mmol/L (ref 3.5–5.3)
Sodium: 142 mmol/L (ref 135–146)
Total Bilirubin: 0.2 mg/dL (ref 0.2–1.2)
Total Protein: 7.2 g/dL (ref 6.1–8.1)
eGFR: 64 mL/min/{1.73_m2} (ref >=60)

## 2022-11-04 LAB — HEPATITIS C ANTIBODY: Hepatitis C Ab: NONREACTIVE

## 2022-11-04 NOTE — Telephone Encounter (Signed)
PA submitted on wegovy through cover my meds on 11/04/22

## 2022-11-07 ENCOUNTER — Encounter: Payer: Self-pay | Admitting: Internal Medicine

## 2022-11-10 ENCOUNTER — Telehealth: Payer: Self-pay

## 2022-11-10 NOTE — Telephone Encounter (Signed)
PA done through Covermymeds for [facility] waiting on determination.

## 2022-12-04 NOTE — Progress Notes (Unsigned)
Established Patient Office Visit  Subjective    Patient ID: Sydney [name], female    DOB: 1970-12-15  Age: 52 y.o. MRN: [id number]  CC:  No chief complaint on file.   HPI Sydney [name] presents to follow up on weight loss medications.   Weight Management: -Has been on Zepbound now for 2 months, currently on 5 mg weekly  Environmental Allergies: -Currently on Zyrtec 10 mg as needed   History of Iron Deficiency Anemia/Fibroids: -Labs checked 11/23 ferritin 60.5, hgb 12.2 -Had to have iron infusions in the past -Does have irregular periods, last period was in March, planning possible hysterectomy later this year due to fibroids -Follows with Gynecology   IBS-Constipation: -Takes Senna as needed  -Normally has a BM ever 2-3 days, used to take Trulance PRN  Health Maintenance: -Blood work UTD -Mammogram due -Colon cancer screening: colonoscopy 8/23, repeat in 7 years  -Pap 2021, due, follows with Gynecology   Outpatient Encounter Medications as of 12/05/2022  Medication Sig   Cetirizine HCl (ZYRTEC ALLERGY) 10 MG CAPS Take by oral route.   Multiple Vitamin (MULTIVITAMIN) tablet Take 1 tablet by mouth daily.   Plecanatide (TRULANCE) 3 MG TABS Take 1 tablet (3 mg total) by mouth daily.   tirzepatide [facility]) 5 MG/0.5ML Pen Inject 5 mg into the skin once a week.   No facility-administered encounter medications on file as of 12/05/2022.    Past Medical History:  Diagnosis Date   Abnormal bleeding in menstrual cycle    Anemia    Fibroids    IBS (irritable bowel syndrome)     Past Surgical History:  Procedure Laterality Date   COLONOSCOPY     HYSTEROSCOPY WITH D & C  10/14/2011   Procedure: DILATATION AND CURETTAGE /HYSTEROSCOPY;  Surgeon: Turner Daniels, MD;  Location: [facility];  Service: Gynecology;  Laterality: N/A;  WITH TRUCLEAR morcellation   MYOMECTOMY  2013   x2   NM RENAL LASIX ([facility] HX) Bilateral    UPPER GASTROINTESTINAL ENDOSCOPY      UTERINE FIBROID SURGERY  08/13/2015   Dr. Rana Snare    Family History  Problem Relation Age of Onset   Diabetes Mother    Hypertension Mother    Prostate cancer Paternal Uncle    Cancer Paternal Uncle    Heart disease Maternal Grandmother    Hypertension Paternal Grandmother    Stomach cancer Paternal Grandfather    Hypertension Paternal Grandfather    Cancer Paternal Grandfather    Colon cancer Neg Hx    Colon polyps Neg Hx    Esophageal cancer Neg Hx    Rectal cancer Neg Hx     Social History   Socioeconomic History   Marital status: Married    Spouse name: Dennard Nip   Number of children: 0   Years of education: Associates in Res[employer] Therapy   Highest education level: Not on file  Occupational History   Not on file  Tobacco Use   Smoking status: Never    Passive exposure: Never   Smokeless tobacco: Never  Vaping Use   Vaping Use: Never used  Substance and Sexual Activity   Alcohol use: Yes    Comment: wine - twice  a week   Drug use: No   Sexual activity: Yes    Birth control/protection: None  Other Topics Concern   Not on file  Social History Narrative   02/19/20   From: the area   Living: with husband, Dennard Nip (2015)  Work: Buyer, retail at [employer]      Family: mom is nearby, good relationship      Enjoys: travel, spend time with friends      Exercise: walking the block   Diet: meal prep at home - salads, grilled food, limits fried food      Safety   Seat belts: Yes    Guns: Yes  and secure   Safe in relationships: Yes    Social Determinants of Health   Financial Resource Strain: Low Risk  (11/03/2022)   Overall Financial Resource Strain (CARDIA)    Difficulty of Paying Living Expenses: Not hard at all  Food Insecurity: No Food Insecurity (11/03/2022)   Hunger Vital Sign    Worried About Running Out of Food in the Last Year: Never true    Ran Out of Food in the Last Year: Never true  Transportation Needs: No Transportation Needs (11/03/2022)    PRAPARE - Administrator, Civil Service (Medical): No    Lack of Transportation (Non-Medical): No  Physical Activity: Insufficiently Active (11/03/2022)   Exercise Vital Sign    Days of Exercise per Week: 3 days    Minutes of Exercise per Session: 30 min  Stress: No Stress Concern Present (11/03/2022)   [employer] of Occupational Health - Occupational Stress Questionnaire    Feeling of Stress : Not at all  Social Connections: Moderately Integrated (11/03/2022)   Social Connection and Isolation Panel [NHANES]    Frequency of Communication with Friends and Family: More than three times a week    Frequency of Social Gatherings with Friends and Family: More than three times a week    Attends Religious Services: 1 to 4 times per year    Active Member of [employer] or Organizations: Not on file    Attends Banker Meetings: Never    Marital Status: Married  Catering manager Violence: Not At Risk (11/03/2022)   Humiliation, Afraid, Rape, and Kick questionnaire    Fear of Current or Ex-Partner: No    Emotionally Abused: No    Physically Abused: No    Sexually Abused: No    Review of Systems  All other systems reviewed and are negative.       Objective    There were no vitals taken for this visit.  Physical Exam Constitutional:      Appearance: Normal appearance. She is obese.  HENT:     Head: Normocephalic and atraumatic.     Mouth/Throat:     Mouth: Mucous membranes are moist.     Pharynx: Oropharynx is clear.  Eyes:     Extraocular Movements: Extraocular movements intact.     Conjunctiva/sclera: Conjunctivae normal.     Pupils: Pupils are equal, round, and reactive to light.  Cardiovascular:     Rate and Rhythm: Normal rate and regular rhythm.  Pulmonary:     Effort: Pulmonary effort is normal.     Breath sounds: Normal breath sounds.  Musculoskeletal:     Right lower leg: No edema.     Left lower leg: No edema.  Skin:    General: Skin is warm and  dry.  Neurological:     General: No focal deficit present.     Mental Status: She is alert. Mental status is at baseline.  Psychiatric:        Mood and Affect: Mood normal.        Behavior: Behavior normal.  Assessment & Plan:   1. Obesity (BMI 35.0-39.9 without comorbidity)/Prediabetes: A1c in the office 5.7%. Will prescribe Zepbound 2.5 mg weekly for weight loss.  - POCT HgB A1C - tirzepatide (ZEPBOUND) 2.5 MG/0.5ML Pen; Inject 2.5 mg into the skin once a week.  Dispense: 2 mL; Refill: 0  2. Irritable bowel syndrome with constipation: Uses Senna as needed, will refill Trulance to also use as needed.  - Plecanatide (TRULANCE) 3 MG TABS; Take 1 tablet (3 mg total) by mouth daily.  Dispense: 30 tablet; Refill: 1  3. Environmental allergies: Stable, takes Zyrtec PRN.  4. Iron deficiency anemia due to chronic blood loss/Fibroids: Reviewed labs from November, anemia resolved. Following with Gynecology, discussing potential hysterectomy.    No follow-ups on file.   Margarita Mail, DO

## 2022-12-05 ENCOUNTER — Ambulatory Visit (INDEPENDENT_AMBULATORY_CARE_PROVIDER_SITE_OTHER): Payer: 59 | Admitting: Internal Medicine

## 2022-12-05 ENCOUNTER — Encounter: Payer: Self-pay | Admitting: Internal Medicine

## 2022-12-05 VITALS — BP 124/82 | HR 98 | Temp 97.7°F | Resp 16 | Ht 62.0 in | Wt 183.1 lb

## 2022-12-05 DIAGNOSIS — E669 Obesity, unspecified: Secondary | ICD-10-CM

## 2022-12-05 DIAGNOSIS — R7303 Prediabetes: Secondary | ICD-10-CM

## 2022-12-05 DIAGNOSIS — Z79899 Other long term (current) drug therapy: Secondary | ICD-10-CM | POA: Diagnosis not present

## 2022-12-05 MED ORDER — ZEPBOUND 7.5 MG/0.5ML ~~LOC~~ SOAJ
7.5000 mg | SUBCUTANEOUS | 0 refills | Status: DC
Start: 2022-12-05 — End: 2023-03-14

## 2022-12-07 ENCOUNTER — Other Ambulatory Visit: Payer: Self-pay | Admitting: Internal Medicine

## 2022-12-07 DIAGNOSIS — Z79899 Other long term (current) drug therapy: Secondary | ICD-10-CM

## 2022-12-07 DIAGNOSIS — R7303 Prediabetes: Secondary | ICD-10-CM

## 2022-12-07 DIAGNOSIS — E669 Obesity, unspecified: Secondary | ICD-10-CM

## 2022-12-07 NOTE — Telephone Encounter (Signed)
Requested medication (s) are due for refill today: Yes  Requested medication (s) are on the active medication list: Yes  Last refill:  12/07/22  Future visit scheduled: Yes  Notes to clinic:  Unable to refill per protocol, medication requires a Prior Authorization.      Requested Prescriptions  Pending Prescriptions Disp Refills   ZEPBOUND 7.5 MG/0.5ML Pen [Pharmacy Med Name: ZEPBOUND 7.5 MG/0.5 ML PEN]  0    Sig: INJECT 7.5 MG SUBCUTANEOUSLY WEEKLY     Off-Protocol Failed - 12/07/2022  9:47 AM      Failed - Medication not assigned to a protocol, review manually.      Passed - Valid encounter within last 12 months    Recent Outpatient Visits           2 days ago Obesity (BMI 30.0-34.9)    [facility] Margarita Mail, DO   1 month ago Annual physical exam   [facility] Margarita Mail, DO   2 months ago Obesity (BMI 35.0-39.9 without comorbidity)   [facility] Margarita Mail, DO       Future Appointments             In 3 weeks Margarita Mail, DO [facility], [facility]

## 2022-12-08 ENCOUNTER — Other Ambulatory Visit: Payer: Self-pay | Admitting: Internal Medicine

## 2022-12-08 DIAGNOSIS — E669 Obesity, unspecified: Secondary | ICD-10-CM

## 2022-12-08 DIAGNOSIS — Z79899 Other long term (current) drug therapy: Secondary | ICD-10-CM

## 2022-12-08 DIAGNOSIS — R7303 Prediabetes: Secondary | ICD-10-CM

## 2022-12-09 NOTE — Telephone Encounter (Signed)
Requested medications are due for refill today.  See pharmacy note  Requested medications are on the active medications list.    Last refill. 12/05/2022  Future visit scheduled.   yes  Notes to clinic.  Pharmacy comment: Alternative Requested:PA PLEASE.     Requested Prescriptions  Pending Prescriptions Disp Refills   ZEPBOUND 7.5 MG/0.5ML Pen [Pharmacy Med Name: ZEPBOUND 7.5 MG/0.5 ML PEN]  0    Sig: INJECT 7.5 MG SUBCUTANEOUSLY WEEKLY     Off-Protocol Failed - 12/08/2022 10:47 AM      Failed - Medication not assigned to a protocol, review manually.      Passed - Valid encounter within last 12 months    Recent Outpatient Visits           4 days ago Obesity (BMI 30.0-34.9)   [facility] Margarita Mail, DO   1 month ago Annual physical exam   [facility] Margarita Mail, DO   2 months ago Obesity (BMI 35.0-39.9 without comorbidity)   [facility] Margarita Mail, DO       Future Appointments             In 3 weeks Margarita Mail, DO [facility], [facility]

## 2023-01-02 ENCOUNTER — Ambulatory Visit: Payer: Self-pay | Admitting: Internal Medicine

## 2023-02-23 ENCOUNTER — Encounter: Payer: Self-pay | Admitting: Internal Medicine

## 2023-03-09 ENCOUNTER — Ambulatory Visit: Payer: Managed Care, Other (non HMO) | Admitting: Internal Medicine

## 2023-03-10 ENCOUNTER — Encounter: Payer: Self-pay | Admitting: Internal Medicine

## 2023-03-13 ENCOUNTER — Other Ambulatory Visit: Payer: Self-pay | Admitting: Internal Medicine

## 2023-03-14 ENCOUNTER — Other Ambulatory Visit: Payer: Self-pay | Admitting: Internal Medicine

## 2023-03-14 DIAGNOSIS — E66811 Obesity, class 1: Secondary | ICD-10-CM

## 2023-03-14 DIAGNOSIS — R7303 Prediabetes: Secondary | ICD-10-CM

## 2023-03-14 MED ORDER — TIRZEPATIDE-WEIGHT MANAGEMENT 5 MG/0.5ML ~~LOC~~ SOLN
5.0000 mg | SUBCUTANEOUS | 2 refills | Status: DC
Start: 2023-03-14 — End: 2023-08-14

## 2023-03-28 ENCOUNTER — Other Ambulatory Visit (HOSPITAL_COMMUNITY): Payer: Self-pay

## 2023-03-28 ENCOUNTER — Telehealth: Payer: Self-pay | Admitting: Pharmacy Technician

## 2023-03-28 NOTE — Telephone Encounter (Signed)
Pharmacy Patient Advocate Encounter   Received notification from CoverMyMeds that prior authorization for TRULANCE 3MG  is required/requested.   Insurance verification completed.   The patient is insured through [facility] .   Per test claim: PA required; PA submitted to [employer] via CoverMyMeds Key/confirmation #/EOC [id number] Status is pending

## 2023-03-28 NOTE — Telephone Encounter (Signed)
Under the Media tab patient has scanned her new insurance card for [facility] in July 2024.

## 2023-03-29 ENCOUNTER — Other Ambulatory Visit (HOSPITAL_COMMUNITY): Payer: Self-pay

## 2023-03-29 NOTE — Telephone Encounter (Signed)
Added insurance information from July 2024 [employer] card. Test billing results with [facility] return a copay of $30. No PA is required.

## 2023-07-13 ENCOUNTER — Other Ambulatory Visit: Payer: Self-pay | Admitting: Obstetrics and Gynecology

## 2023-08-10 NOTE — H&P (Signed)
 Chief Complaint:   Patient ID: Sydney [name] is a 53 y.o. female presenting with Pre Op Consulting  on 08/10/2023  HPI:  She started having irregularly heavy cycles 2013. She went to see a new doctor who dx her with fibroid uterus and she had a vaginal myomectomy. She has now tried almost everything to slow down her periods, and a hysterectomy has been recommended to her for management at this point.    She finally went almost 200 days without a period, now she had 2 periods in the last month.    09/2022 FSH: 121  08/2021 FSH: 15    She has had iron infusions two separate times.    Pertinent Hx: - Hx of hysteroscopic myomectomy of 2 small (1.5 cm, 2.5 cm) intracavitary fibroids by Dr. Rana Snare in 2013  - 2017: Repeat hysteroscopic myomectomy of a single small fibroid. Otherwise, normal cavity.    - Last pap 09/2022 neg/neg The patient underwent a preliminary transvaginal pelvic sonogram. [place] ultrasound   Enlarged fibroid uterus with heterogenous myometrium. Multiple small fibroids noted, largest 3 were measured. Please see details above. Posterior mid body fibroid seen impinging into the endometrium, above internal os. Other fibroids intramural without serosal involvement  Ut measuring 12x10x8cm  Right ovary was not visualized due to fibroids. Right adnexa appears within normal limits.  Left ovary appears within normal limits.   Past Medical History:  has a past medical history of Anemia (11/2021), Female infertility due to diminished ovarian reserve, and Fibroid (09/2011).  Past Surgical History:  has a past surgical history that includes Hysteroscopy (11/2011 & 02/2013). Family History: family history includes High blood pressure (Hypertension) in her father and mother; Prostate cancer in her father. Social History:  reports that she has never smoked. She has never used smokeless tobacco. She reports current alcohol use of about 2.0 standard drinks of alcohol per week. She reports  that she does not use drugs. OB/GYN History:  OB History    Gravida 1  Para    Term    Preterm    AB 1  Living      SAB 1  IAB    Ectopic    Molar    Multiple    Live Births         Allergies: has no allergies on file. Medications:  Current Outpatient Medications:    cetirizine (ZYRTEC) 10 mg capsule, Take by mouth, Disp: , Rfl:    ibuprofen (MOTRIN) 800 MG tablet, Take 800 mg by mouth 3 (three) times daily as needed, Disp: , Rfl:    multivitamin tablet, Take 1 tablet by mouth once daily, Disp: , Rfl:    plecanatide (TRULANCE) 3 mg tablet, Take 3 mg by mouth, Disp: , Rfl:    tranexamic acid (LYSTEDA) 650 mg tablet, TAKE 2 TABLETS BY MOUTH 3 TIMES A DAY AS NEEDED., Disp: , Rfl:    tirzepatide (ZEPBOUND) 5 mg/0.5 mL injection, Inject 5 mg subcutaneously (Patient not taking: Reported on 08/10/2023), Disp: , Rfl:    ZEPBOUND 7.5 mg/0.5 mL pen injector, INJECT 7.5 MG SUBCUTANEOUSLY WEEKLY (Patient not taking: Reported on 08/10/2023), Disp: , Rfl:   Review of Systems: No SOB, no palpitations or chest pain, no new lower extremity edema, no nausea or vomiting or bowel or bladder complaints. See HPI for gyn specific ROS.   Exam:    BP 134/81   Pulse 102   Ht 157.5 cm (5\' 2" )   Wt 76.2 kg (168 lb)  LMP 08/03/2023 (Exact Date)   BMI 30.73 kg/m   General: Patient is well-groomed, well-nourished, appears stated age in no acute distress   HEENT: head is atraumatic and normocephalic, trachea is midline, neck is supple with no palpable nodules   CV: Regular rhythm and normal heart rate, no murmur   Pulm: Clear to auscultation throughout lung fields with no wheezing, crackles, or rhonchi. No increased work of breathing  Abdomen: soft , no mass, non-tender, no rebound tenderness, no hepatomegaly  Pelvic: tanner stage 5 ,   External genitalia: vulva /labia no lesions  Urethra: no prolapse  Vagina: normal physiologic d/c, laxity in vaginal  walls  Cervix: no lesions, no cervical motion tenderness, good descent  Uterus: normal size shape and contour, non-tender  Adnexa: no mass,  non-tender    Rectovaginal: External wnl  A female chaperone was present for the more sensitive portions of the physical exam ( such as breast and pelvic)       Impression:  The primary encounter diagnosis was Excessive or frequent menstruation. A diagnosis of Uterine leiomyoma, unspecified location was also pertinent to this visit.    Plan:  Plan for excite removal  Multiple fibroids present, largest being 3 cm, causing enlargement of the uterus to 11 cm. Frequent and heavy menstrual cycles. Previous history of two myomectomies. Patient desires hysterectomy. -Plan for laparoscopic hysterectomy, with the understanding that conversion to an abdominal hysterectomy may be necessary if laparoscopic approach is not feasible due to scar tissue.  The patient and I discussed the technical aspects of the procedure including the potential for risks and complications.  These include but are not limited to the risk of infection requiring post-operative antibiotics or further procedures.  We talked about the risk of injury to adjacent organs including bladder, bowel, ureter, blood vessels or nerves.  We talked about the need to convert to an open incision.  We talked about the possible need for blood transfusion.  We talked about postop complications such as thromboembolic or cardiopulmonary complications.  All of her questions were answered.  Her preoperative exam was completed and the appropriate consents were signed. She is scheduled to undergo this procedure in the near future.  Will capture images from the robot onto her thumb drive   Return if symptoms worsen or fail to improve, for Postop check.  Cecilie Kicks, MD

## 2023-08-17 ENCOUNTER — Encounter
Admission: RE | Admit: 2023-08-17 | Discharge: 2023-08-17 | Disposition: A | Discharge status: Home / Self Care | Source: Ambulatory Visit | Attending: Obstetrics and Gynecology | Admitting: Obstetrics and Gynecology

## 2023-08-17 ENCOUNTER — Encounter
Admission: RE | Admit: 2023-08-17 | Discharge: 2023-08-17 | Disposition: A | Discharge status: Home / Self Care | Payer: BC Managed Care – PPO | Source: Ambulatory Visit | Attending: Obstetrics and Gynecology | Admitting: Obstetrics and Gynecology

## 2023-08-17 ENCOUNTER — Encounter: Payer: Self-pay | Admitting: Urgent Care

## 2023-08-17 ENCOUNTER — Other Ambulatory Visit: Payer: Self-pay

## 2023-08-17 VITALS — Ht 62.0 in | Wt 168.0 lb

## 2023-08-17 DIAGNOSIS — Z01812 Encounter for preprocedural laboratory examination: Secondary | ICD-10-CM | POA: Insufficient documentation

## 2023-08-17 DIAGNOSIS — D251 Intramural leiomyoma of uterus: Secondary | ICD-10-CM | POA: Insufficient documentation

## 2023-08-17 DIAGNOSIS — N951 Menopausal and female climacteric states: Secondary | ICD-10-CM

## 2023-08-17 HISTORY — DX: Gastro-esophageal reflux disease without esophagitis: K21.9

## 2023-08-17 HISTORY — DX: Headache, unspecified: R51.9

## 2023-08-17 LAB — CBC
HCT: 27.5 % — ABNORMAL LOW (ref 36.0–46.0)
Hemoglobin: 8.7 g/dL — ABNORMAL LOW (ref 12.0–15.0)
MCH: 24.7 pg — ABNORMAL LOW (ref 26.0–34.0)
MCHC: 31.6 g/dL (ref 30.0–36.0)
MCV: 78.1 fL — ABNORMAL LOW (ref 80.0–100.0)
Platelets: 333 10*3/uL (ref 150–400)
RBC: 3.52 MIL/uL — ABNORMAL LOW (ref 3.87–5.11)
RDW: 14 % (ref 11.5–15.5)
WBC: 5.7 10*3/uL (ref 4.0–10.5)
nRBC: 0 % (ref 0.0–0.2)

## 2023-08-17 LAB — TYPE AND SCREEN
ABO/RH(D): A POS
Antibody Screen: NEGATIVE

## 2023-08-17 NOTE — Pre-Procedure Instructions (Signed)
 Patient denies taking GLp-1 medications. She was educated regarding need to stop these types of medication at least 7 days prior to any surgery. She voiced understanding.

## 2023-08-17 NOTE — Patient Instructions (Addendum)
 Your procedure is scheduled on: Friday 08/25/23 To find out your arrival time, please call [phone] between 1PM - 3PM on: Thursday 08/24/23   Report to the Registration Desk on the 1st floor of the Medical Mall. Free Valet parking is available.  If your arrival time is 6:00 am, do not arrive before that time as the Medical Mall entrance doors do not open until 6:00 am.  REMEMBER: Instructions that are not followed completely may result in serious medical risk, up to and including death; or upon the discretion of your surgeon and anesthesiologist your surgery may need to be rescheduled.  Do not eat food after midnight the night before surgery.  No gum chewing or hard candies.  You may however, drink CLEAR liquids up to 2 hours before you are scheduled to arrive for your surgery. Do not drink anything within 2 hours of your scheduled arrival time.  Clear liquids include: - water  - apple juice without pulp - gatorade (not RED colors) - black coffee or tea (Do NOT add milk or creamers to the coffee or tea) Do NOT drink anything that is not on this list.  Type 1 and Type 2 diabetics should only drink water.  One week prior to surgery: Stop Anti-inflammatories (NSAIDS) such as Advil, Aleve, Ibuprofen, Motrin, Naproxen, Naprosyn and Aspirin based products such as Excedrin, Goody's Powder, BC Powder. You may however, continue to take Tylenol if needed for pain up until the day of surgery.  Stop ANY OVER THE COUNTER supplements until after surgery.  Continue taking all prescribed medications. :  TAKE ONLY THESE MEDICATIONS THE MORNING OF SURGERY WITH A SIP OF WATER:  none  No Alcohol for 24 hours before or after surgery.  No Smoking including e-cigarettes for 24 hours before surgery.  No chewable tobacco products for at least 6 hours before surgery.  No nicotine patches on the day of surgery.  Do not use any "recreational" drugs for at least a week (preferably 2 weeks) before  your surgery.  Please be advised that the combination of cocaine and anesthesia may have negative outcomes, up to and including death. If you test positive for cocaine, your surgery will be cancelled.  On the morning of surgery brush your teeth with toothpaste and water, you may rinse your mouth with mouthwash if you wish. Do not swallow any toothpaste or mouthwash.  Use CHG Soap or wipes as directed on instruction sheet.  Do not wear lotions, powders, or perfumes.   Do not shave body hair from the neck down 48 hours before surgery.  Wear comfortable clothing (specific to your surgery type) to the hospital.  Do not wear jewelry, make-up, hairpins, clips or nail polish.  For welded (permanent) jewelry: bracelets, anklets, waist bands, etc.  Please have this removed prior to surgery.  If it is not removed, there is a chance that hospital personnel will need to cut it off on the day of surgery. Contact lenses, hearing aids and dentures may not be worn into surgery.  Do not bring valuables to the hospital. [facility] is not responsible for any missing/lost belongings or valuables.   Notify your doctor if there is any change in your medical condition (cold, fever, infection).  If you are being discharged the day of surgery, you will not be allowed to drive home. You will need a responsible individual to drive you home and stay with you for 24 hours after surgery.   If you are taking public transportation,  you will need to have a responsible individual with you.  If you are being admitted to the hospital overnight, leave your suitcase in the car. After surgery it may be brought to your room.  In case of increased patient census, it may be necessary for you, the patient, to continue your postoperative care in the Same Day Surgery department.  After surgery, you can help prevent lung complications by doing breathing exercises.  Take deep breaths and cough every 1-2 hours. Your doctor may  order a device called an Incentive Spirometer to help you take deep breaths. When coughing or sneezing, hold a pillow firmly against your incision with both hands. This is called "splinting." Doing this helps protect your incision. It also decreases belly discomfort.  Surgery Visitation Policy:  Patients undergoing a surgery or procedure may have two family members or support persons with them as long as the person is not COVID-19 positive or experiencing its symptoms.   Inpatient Visitation:    Visiting hours are 7 a.m. to 8 p.m. Up to four visitors are allowed at one time in a patient room. The visitors may rotate out with other people during the day. One designated support person (adult) may remain overnight.  Due to an increase in RSV and influenza rates and associated hospitalizations, children ages 65 and under will not be able to visit patients in [facility]. Masks continue to be strongly recommended.  Please call the Pre-admissions Testing Dept. at [phone] if you have any questions about these instructions.     Preparing for Surgery with CHLORHEXIDINE GLUCONATE (CHG) Soap  Chlorhexidine Gluconate (CHG) Soap  o An antiseptic cleaner that kills germs and bonds with the skin to continue killing germs even after washing  o Used for showering the night before surgery and morning of surgery  Before surgery, you can play an important role by reducing the number of germs on your skin.  CHG (Chlorhexidine gluconate) soap is an antiseptic cleanser which kills germs and bonds with the skin to continue killing germs even after washing.  Please do not use if you have an allergy to CHG or antibacterial soaps. If your skin becomes reddened/irritated stop using the CHG.  1. Shower the NIGHT BEFORE SURGERY and the MORNING OF SURGERY with CHG soap.  2. If you choose to wash your hair, wash your hair first as usual with your normal shampoo.  3. After shampooing, rinse your  hair and body thoroughly to remove the shampoo.  4. Use CHG as you would any other liquid soap. You can apply CHG directly to the skin and wash gently with a scrungie or a clean washcloth.  5. Apply the CHG soap to your body only from the neck down. Do not use on open wounds or open sores. Avoid contact with your eyes, ears, mouth, and genitals (private parts). Wash face and genitals (private parts) with your normal soap.  6. Wash thoroughly, paying special attention to the area where your surgery will be performed.  7. Thoroughly rinse your body with warm water.  8. Do not shower/wash with your normal soap after using and rinsing off the CHG soap.  9. Pat yourself dry with a clean towel.  10. Wear clean pajamas to bed the night before surgery.  12. Place clean sheets on your bed the night of your first shower and do not sleep with pets.  13. Shower again with the CHG soap on the day of surgery prior to arriving at  the hospital.  14. Do not apply any deodorants/lotions/powders.  15. Please wear clean clothes to the hospital.

## 2023-08-25 ENCOUNTER — Other Ambulatory Visit: Payer: Self-pay

## 2023-08-25 ENCOUNTER — Encounter: Payer: Self-pay | Admitting: Obstetrics and Gynecology

## 2023-08-25 ENCOUNTER — Ambulatory Visit: Admitting: Certified Registered"

## 2023-08-25 ENCOUNTER — Encounter
Admission: RE | Disposition: A | Discharge status: Home / Self Care | Payer: Self-pay | Source: Ambulatory Visit | Attending: Obstetrics and Gynecology

## 2023-08-25 ENCOUNTER — Ambulatory Visit
Admission: RE | Admit: 2023-08-25 | Discharge: 2023-08-25 | Disposition: A | Discharge status: Home / Self Care | Payer: BC Managed Care – PPO | Source: Ambulatory Visit | Attending: Obstetrics and Gynecology | Admitting: Obstetrics and Gynecology

## 2023-08-25 DIAGNOSIS — N736 Female pelvic peritoneal adhesions (postinfective): Secondary | ICD-10-CM | POA: Insufficient documentation

## 2023-08-25 DIAGNOSIS — D282 Benign neoplasm of uterine tubes and ligaments: Secondary | ICD-10-CM | POA: Insufficient documentation

## 2023-08-25 DIAGNOSIS — N8003 Adenomyosis of the uterus: Secondary | ICD-10-CM | POA: Insufficient documentation

## 2023-08-25 DIAGNOSIS — N921 Excessive and frequent menstruation with irregular cycle: Secondary | ICD-10-CM | POA: Insufficient documentation

## 2023-08-25 DIAGNOSIS — N888 Other specified noninflammatory disorders of cervix uteri: Secondary | ICD-10-CM | POA: Diagnosis not present

## 2023-08-25 DIAGNOSIS — D259 Leiomyoma of uterus, unspecified: Secondary | ICD-10-CM | POA: Diagnosis present

## 2023-08-25 DIAGNOSIS — D251 Intramural leiomyoma of uterus: Secondary | ICD-10-CM | POA: Insufficient documentation

## 2023-08-25 DIAGNOSIS — N951 Menopausal and female climacteric states: Secondary | ICD-10-CM

## 2023-08-25 DIAGNOSIS — K219 Gastro-esophageal reflux disease without esophagitis: Secondary | ICD-10-CM | POA: Insufficient documentation

## 2023-08-25 DIAGNOSIS — N838 Other noninflammatory disorders of ovary, fallopian tube and broad ligament: Secondary | ICD-10-CM | POA: Diagnosis not present

## 2023-08-25 LAB — POCT I-STAT, CHEM 8
BUN: 10 mg/dL (ref 6–20)
Calcium, Ion: 1.21 mmol/L (ref 1.15–1.40)
Chloride: 108 mmol/L (ref 98–111)
Creatinine, Ser: 1 mg/dL (ref 0.44–1.00)
Glucose, Bld: 89 mg/dL (ref 70–99)
HCT: 27 % — ABNORMAL LOW (ref 36.0–46.0)
Hemoglobin: 9.2 g/dL — ABNORMAL LOW (ref 12.0–15.0)
Potassium: 3.9 mmol/L (ref 3.5–5.1)
Sodium: 140 mmol/L (ref 135–145)
TCO2: 22 mmol/L (ref 22–32)

## 2023-08-25 LAB — ABO/RH: ABO/RH(D): A POS

## 2023-08-25 LAB — POCT PREGNANCY, URINE: Preg Test, Ur: NEGATIVE

## 2023-08-25 SURGERY — XI ROBOTIC ASSISTED LAPAROSCOPIC HYSTERECTOMY AND SALPINGECTOMY
Anesthesia: General

## 2023-08-25 MED ORDER — PROPOFOL 10 MG/ML IV BOLUS
INTRAVENOUS | Status: AC
  Filled 2023-08-25: qty 20

## 2023-08-25 MED ORDER — LACTATED RINGERS IV SOLN: INTRAVENOUS | Status: DC

## 2023-08-25 MED ORDER — FENTANYL CITRATE (PF) 100 MCG/2ML IJ SOLN
INTRAMUSCULAR | Status: AC
  Filled 2023-08-25: qty 2

## 2023-08-25 MED ORDER — KETOROLAC TROMETHAMINE 30 MG/ML IJ SOLN
INTRAMUSCULAR | Status: DC | PRN
  Administered 2023-08-25: 30 mg via INTRAVENOUS

## 2023-08-25 MED ORDER — DOCUSATE SODIUM 100 MG PO CAPS: 100.0000 mg | ORAL_CAPSULE | Freq: Two times a day (BID) | ORAL | 0 refills | Status: DC

## 2023-08-25 MED ORDER — ACETAMINOPHEN 500 MG PO TABS
ORAL_TABLET | ORAL | Status: AC
  Filled 2023-08-25: qty 2

## 2023-08-25 MED ORDER — FENTANYL CITRATE (PF) 100 MCG/2ML IJ SOLN
25.0000 ug | INTRAMUSCULAR | Status: DC | PRN
  Administered 2023-08-25: 50 ug via INTRAVENOUS
  Administered 2023-08-25: 25 ug via INTRAVENOUS
  Administered 2023-08-25: 50 ug via INTRAVENOUS
  Administered 2023-08-25: 25 ug via INTRAVENOUS

## 2023-08-25 MED ORDER — CHLORHEXIDINE GLUCONATE 0.12 % MT SOLN
15.0000 mL | Freq: Once | OROMUCOSAL | Status: AC
  Administered 2023-08-25: 15 mL via OROMUCOSAL

## 2023-08-25 MED ORDER — PROPOFOL 10 MG/ML IV BOLUS
INTRAVENOUS | Status: DC | PRN
  Administered 2023-08-25: 150 ug/kg/min via INTRAVENOUS
  Administered 2023-08-25: 80 mg via INTRAVENOUS

## 2023-08-25 MED ORDER — POVIDONE-IODINE 10 % EX SWAB
2.0000 | Freq: Once | CUTANEOUS | Status: AC
  Administered 2023-08-25: 2 via TOPICAL

## 2023-08-25 MED ORDER — FENTANYL CITRATE (PF) 100 MCG/2ML IJ SOLN
INTRAMUSCULAR | Status: DC | PRN
  Administered 2023-08-25 (×3): 50 ug via INTRAVENOUS

## 2023-08-25 MED ORDER — CELECOXIB 200 MG PO CAPS
400.0000 mg | ORAL_CAPSULE | ORAL | Status: AC
  Administered 2023-08-25: 400 mg via ORAL

## 2023-08-25 MED ORDER — OXYCODONE HCL 5 MG PO TABS: 5.0000 mg | ORAL_TABLET | ORAL | 0 refills | Status: DC | PRN

## 2023-08-25 MED ORDER — KETAMINE HCL 50 MG/5ML IJ SOSY
PREFILLED_SYRINGE | INTRAMUSCULAR | Status: AC
  Filled 2023-08-25: qty 5

## 2023-08-25 MED ORDER — ONDANSETRON HCL 4 MG/2ML IJ SOLN
INTRAMUSCULAR | Status: DC | PRN
  Administered 2023-08-25: 4 mg via INTRAVENOUS

## 2023-08-25 MED ORDER — CEFAZOLIN SODIUM-DEXTROSE 2-4 GM/100ML-% IV SOLN
2.0000 g | INTRAVENOUS | Status: AC
  Administered 2023-08-25 (×2): 2 g via INTRAVENOUS

## 2023-08-25 MED ORDER — ACETAMINOPHEN 500 MG PO TABS
1000.0000 mg | ORAL_TABLET | ORAL | Status: AC
  Administered 2023-08-25: 1000 mg via ORAL

## 2023-08-25 MED ORDER — MIDAZOLAM HCL 2 MG/2ML IJ SOLN
INTRAMUSCULAR | Status: DC | PRN
  Administered 2023-08-25: 2 mg via INTRAVENOUS

## 2023-08-25 MED ORDER — BUPIVACAINE HCL (PF) 0.5 % IJ SOLN
INTRAMUSCULAR | Status: DC | PRN
  Administered 2023-08-25: 14 mL

## 2023-08-25 MED ORDER — PHENYLEPHRINE 80 MCG/ML (10ML) SYRINGE FOR IV PUSH (FOR BLOOD PRESSURE SUPPORT)
PREFILLED_SYRINGE | INTRAVENOUS | Status: DC | PRN
  Administered 2023-08-25 (×2): 80 ug via INTRAVENOUS

## 2023-08-25 MED ORDER — CEFAZOLIN SODIUM-DEXTROSE 2-4 GM/100ML-% IV SOLN
INTRAVENOUS | Status: AC
  Filled 2023-08-25: qty 100

## 2023-08-25 MED ORDER — FLUORESCEIN SODIUM 10 % IV SOLN
INTRAVENOUS | Status: AC
  Filled 2023-08-25: qty 5

## 2023-08-25 MED ORDER — ACETAMINOPHEN EXTRA STRENGTH 500 MG PO TABS: 1000.0000 mg | ORAL_TABLET | Freq: Four times a day (QID) | ORAL | 0 refills | Status: AC

## 2023-08-25 MED ORDER — ORAL CARE MOUTH RINSE: 15.0000 mL | Freq: Once | OROMUCOSAL | Status: AC

## 2023-08-25 MED ORDER — PROPOFOL 1000 MG/100ML IV EMUL
INTRAVENOUS | Status: AC
  Filled 2023-08-25: qty 100

## 2023-08-25 MED ORDER — METRONIDAZOLE 500 MG/100ML IV SOLN
500.0000 mg | Freq: Once | INTRAVENOUS | Status: DC
  Filled 2023-08-25: qty 100

## 2023-08-25 MED ORDER — LIDOCAINE HCL (CARDIAC) PF 100 MG/5ML IV SOSY
PREFILLED_SYRINGE | INTRAVENOUS | Status: DC | PRN
  Administered 2023-08-25: 60 mg via INTRAVENOUS

## 2023-08-25 MED ORDER — BUPIVACAINE HCL (PF) 0.5 % IJ SOLN
INTRAMUSCULAR | Status: AC
  Filled 2023-08-25: qty 30

## 2023-08-25 MED ORDER — ROCURONIUM BROMIDE 100 MG/10ML IV SOLN
INTRAVENOUS | Status: DC | PRN
  Administered 2023-08-25: 50 mg via INTRAVENOUS
  Administered 2023-08-25 (×2): 20 mg via INTRAVENOUS
  Administered 2023-08-25: 10 mg via INTRAVENOUS
  Administered 2023-08-25: 20 mg via INTRAVENOUS

## 2023-08-25 MED ORDER — CHLORHEXIDINE GLUCONATE 0.12 % MT SOLN
OROMUCOSAL | Status: AC
  Filled 2023-08-25: qty 15

## 2023-08-25 MED ORDER — OXYCODONE HCL 5 MG PO TABS
5.0000 mg | ORAL_TABLET | Freq: Once | ORAL | Status: AC | PRN
  Administered 2023-08-25: 5 mg via ORAL

## 2023-08-25 MED ORDER — OXYCODONE HCL 5 MG PO TABS
ORAL_TABLET | ORAL | Status: AC
  Filled 2023-08-25: qty 1

## 2023-08-25 MED ORDER — METRONIDAZOLE 500 MG/100ML IV SOLN
INTRAVENOUS | Status: DC | PRN
  Administered 2023-08-25: 500 mg via INTRAVENOUS

## 2023-08-25 MED ORDER — SUGAMMADEX SODIUM 200 MG/2ML IV SOLN
INTRAVENOUS | Status: DC | PRN
  Administered 2023-08-25: 200 mg via INTRAVENOUS

## 2023-08-25 MED ORDER — GABAPENTIN 300 MG PO CAPS
300.0000 mg | ORAL_CAPSULE | Freq: Every day | ORAL | 0 refills | Status: DC
Start: 2023-08-25 — End: 2023-11-16

## 2023-08-25 MED ORDER — FLUORESCEIN SODIUM 10 % IV SOLN
INTRAVENOUS | Status: DC | PRN
  Administered 2023-08-25: 100 mg via INTRAVENOUS

## 2023-08-25 MED ORDER — KETAMINE HCL 50 MG/5ML IJ SOSY
PREFILLED_SYRINGE | INTRAMUSCULAR | Status: DC | PRN
  Administered 2023-08-25: 20 mg via INTRAVENOUS

## 2023-08-25 MED ORDER — CELECOXIB 200 MG PO CAPS: 200.0000 mg | ORAL_CAPSULE | Freq: Two times a day (BID) | ORAL | 0 refills | Status: AC

## 2023-08-25 MED ORDER — ONDANSETRON 4 MG PO TBDP: 4.0000 mg | ORAL_TABLET | Freq: Three times a day (TID) | ORAL | 0 refills | Status: AC | PRN

## 2023-08-25 MED ORDER — MIDAZOLAM HCL 2 MG/2ML IJ SOLN
INTRAMUSCULAR | Status: AC
  Filled 2023-08-25: qty 2

## 2023-08-25 MED ORDER — FUROSEMIDE 10 MG/ML IJ SOLN
INTRAMUSCULAR | Status: DC | PRN
  Administered 2023-08-25: 10 mg via INTRAMUSCULAR

## 2023-08-25 MED ORDER — DEXAMETHASONE SODIUM PHOSPHATE 10 MG/ML IJ SOLN
INTRAMUSCULAR | Status: DC | PRN
  Administered 2023-08-25: 10 mg via INTRAVENOUS

## 2023-08-25 MED ORDER — OXYCODONE HCL 5 MG/5ML PO SOLN: 5.0000 mg | Freq: Once | ORAL | Status: AC | PRN

## 2023-08-25 MED ORDER — ACETAMINOPHEN 500 MG PO TABS
1000.0000 mg | ORAL_TABLET | Freq: Once | ORAL | Status: AC
  Administered 2023-08-25: 1000 mg via ORAL

## 2023-08-25 MED ORDER — CELECOXIB 200 MG PO CAPS
ORAL_CAPSULE | ORAL | Status: AC
  Filled 2023-08-25: qty 2

## 2023-08-25 SURGICAL SUPPLY — 74 items
ANCHOR TIS RET SYS 1550ML (BAG) IMPLANT
APPLICATOR ARISTA FLEXITIP XL (MISCELLANEOUS) IMPLANT
BAG URINE DRAIN 2000ML AR STRL (UROLOGICAL SUPPLIES) ×2 IMPLANT
BLADE SURG SZ11 CARB STEEL (BLADE) ×2 IMPLANT
CANNULA CAP OBTURATR AIRSEAL 8 (CAP) ×2 IMPLANT
CATH ROBINSON RED A/P 16FR (CATHETERS) ×2 IMPLANT
CATH URTH 16FR FL 2W BLN LF (CATHETERS) ×2 IMPLANT
COUNTER NDL MAGNETIC 40 RED (SET/KITS/TRAYS/PACK) ×2 IMPLANT
COUNTER NEEDLE MAGNETIC 40 RED (SET/KITS/TRAYS/PACK) ×2 IMPLANT
COVER TIP SHEARS 8 DVNC (MISCELLANEOUS) ×2 IMPLANT
DERMABOND ADVANCED .7 DNX12 (GAUZE/BANDAGES/DRESSINGS) ×2 IMPLANT
DRAPE ARM DVNC X/XI (DISPOSABLE) ×8 IMPLANT
DRAPE COLUMN DVNC XI (DISPOSABLE) ×2 IMPLANT
DRAPE SHEET LG 3/4 BI-LAMINATE (DRAPES) ×2 IMPLANT
DRIVER NDL MEGA 8 DVNC XI (INSTRUMENTS) ×2 IMPLANT
DRIVER NDLE MEGA DVNC XI (INSTRUMENTS) ×2 IMPLANT
DRSG TEGADERM 2-3/8X2-3/4 SM (GAUZE/BANDAGES/DRESSINGS) IMPLANT
ELECT REM PT RETURN 9FT ADLT (ELECTROSURGICAL) ×2 IMPLANT
ELECTRODE REM PT RTRN 9FT ADLT (ELECTROSURGICAL) ×2 IMPLANT
FORCEPS BPLR FENES DVNC XI (FORCEP) ×2 IMPLANT
FORCEPS BPLR LNG DVNC XI (INSTRUMENTS) IMPLANT
GAUZE 4X4 16PLY ~~LOC~~+RFID DBL (SPONGE) ×2 IMPLANT
GAUZE SPONGE 2X2 STRL 8-PLY (GAUZE/BANDAGES/DRESSINGS) IMPLANT
GLOVE BIO SURGEON STRL SZ7 (GLOVE) ×8 IMPLANT
GLOVE BIOGEL PI IND STRL 7.5 (GLOVE) ×8 IMPLANT
GLOVE INDICATOR 7.5 STRL GRN (GLOVE) ×2 IMPLANT
GOWN STRL REUS W/ TWL LRG LVL3 (GOWN DISPOSABLE) ×12 IMPLANT
HANDLE YANKAUER SUCT BULB TIP (MISCELLANEOUS) IMPLANT
HEMOSTAT ARISTA ABSORB 3G PWDR (HEMOSTASIS) IMPLANT
IRRIGATION STRYKERFLOW (MISCELLANEOUS) IMPLANT
IRRIGATOR STRYKERFLOW (MISCELLANEOUS) IMPLANT
IV LACTATED RINGERS 1000ML (IV SOLUTION) ×2 IMPLANT
IV NS 1000ML BAXH (IV SOLUTION) IMPLANT
KIT PINK PAD W/HEAD ARE REST (MISCELLANEOUS) ×2 IMPLANT
KIT PINK PAD W/HEAD ARM REST (MISCELLANEOUS) ×2 IMPLANT
LABEL OR SOLS (LABEL) ×2 IMPLANT
MANIFOLD NEPTUNE II (INSTRUMENTS) ×2 IMPLANT
MANIPULATOR VCARE LG CRV RETR (MISCELLANEOUS) IMPLANT
MANIPULATOR VCARE SML CRV RETR (MISCELLANEOUS) IMPLANT
MANIPULATOR VCARE STD CRV RETR (MISCELLANEOUS) IMPLANT
NS IRRIG 1000ML POUR BTL (IV SOLUTION) ×2 IMPLANT
NS IRRIG 500ML POUR BTL (IV SOLUTION) ×2 IMPLANT
OBTURATOR OPTICAL STND 8 DVNC (TROCAR) ×2 IMPLANT
OBTURATOR OPTICALSTD 8 DVNC (TROCAR) ×2 IMPLANT
OCCLUDER COLPOPNEUMO (BALLOONS) ×2 IMPLANT
PACK GYN LAPAROSCOPIC (MISCELLANEOUS) ×2 IMPLANT
PAD OB MATERNITY 11 LF (PERSONAL CARE ITEMS) ×2 IMPLANT
PAD PREP OB/GYN DISP 24X41 (PERSONAL CARE ITEMS) ×2 IMPLANT
RETRACTOR RING XSMALL (MISCELLANEOUS) IMPLANT
RTRCTR WOUND ALEXIS 13CM XS SH (MISCELLANEOUS) ×2 IMPLANT
SCISSORS MNPLR CVD DVNC XI (INSTRUMENTS) ×2 IMPLANT
SCRUB CHG 4% DYNA-HEX 4OZ (MISCELLANEOUS) ×2 IMPLANT
SEAL UNIV 5-12 XI (MISCELLANEOUS) ×6 IMPLANT
SEALER VESSEL EXT DVNC XI (MISCELLANEOUS) ×2 IMPLANT
SET CYSTO W/LG BORE CLAMP LF (SET/KITS/TRAYS/PACK) ×2 IMPLANT
SET TUBE FILTERED XL AIRSEAL (SET/KITS/TRAYS/PACK) ×2 IMPLANT
SOL ELECTROSURG ANTI STICK (MISCELLANEOUS) ×2 IMPLANT
SOL PREP PVP 2OZ (MISCELLANEOUS) ×2 IMPLANT
SOLUTION ELECTROSURG ANTI STCK (MISCELLANEOUS) ×2 IMPLANT
SOLUTION PREP PVP 2OZ (MISCELLANEOUS) ×2 IMPLANT
SURGILUBE 2OZ TUBE FLIPTOP (MISCELLANEOUS) ×2 IMPLANT
SUT MNCRL 4-0 27 PS-2 XMFL (SUTURE) ×2 IMPLANT
SUT STRATA 2-0 30 CT-2 (SUTURE) ×2 IMPLANT
SUT VIC AB 0 CT2 27 (SUTURE) ×4 IMPLANT
SUT VICRYL 0 UR6 27IN ABS (SUTURE) IMPLANT
SUT VLOC 90 0 VL 30X27 GL22 (SUTURE) IMPLANT
SUTURE MNCRL 4-0 27XMF (SUTURE) ×2 IMPLANT
SYR 50ML LL SCALE MARK (SYRINGE) ×2 IMPLANT
SYS KII OPTICAL ACCESS 15MM (TROCAR) ×2 IMPLANT
SYS LAPSCP GELPORT 120MM (MISCELLANEOUS) ×2 IMPLANT
SYSTEM KII OPTICAL ACCESS 15MM (TROCAR) IMPLANT
SYSTEM LAPSCP GELPORT 120MM (MISCELLANEOUS) IMPLANT
TRAP FLUID SMOKE EVACUATOR (MISCELLANEOUS) ×2 IMPLANT
WATER STERILE IRR 500ML POUR (IV SOLUTION) ×2 IMPLANT

## 2023-08-25 NOTE — Op Note (Addendum)
 Sydney [name] PROCEDURE DATE: 08/25/2023  PREOPERATIVE DIAGNOSIS: Enlarged fibroid uterus POSTOPERATIVE DIAGNOSIS: The same + broad ligament fibroids at the level of the right uterine artery.  Endometriosis and pelvic adhesive disease. PROCEDURE:  XI ROBOTIC ASSISTED LAPAROSCOPIC HYSTERECTOMY AND SALPINGECTOMY:  CYSTOSCOPY: 52000 (CPT) Ureterolysis because of retroperitoneal fibroids placement  Modifer 22-this case was significantly difficult due to the positioning of the fibroids.  They invaded into the broad ligament on the right with fibroids at the level of the internal cervical os and the uterine arteries on the side.  This anatomy confers a significant significant risk of intraoperative hemorrhage, in addition to her heavy menometrorrhagia.  The patient also started out with anemia (8.7) despite history of IV iron infusions.  A trained assistant was required to decrease the risk of complication in this complex case  SURGEON:  Dr. Christeen Douglas, MD ASSISTANT: Dr. Kathalene Frames, MD  Anesthesiologist:  Anesthesiologist: Darleene Cleaver, Gerrit Heck, MD; Stephanie Coup, MD CRNA: Lanell Matar, CRNA; Jaye Beagle, CRNA  INDICATIONS: 53 y.o. F  here for definitive surgical management secondary to the indications listed under preoperative diagnoses; please see preoperative note for further details.  Risks of surgery were discussed with the patient including but not limited to: bleeding which may require transfusion or reoperation; infection which may require antibiotics; injury to bowel, bladder, ureters or other surrounding organs; need for additional procedures; thromboembolic phenomenon, incisional problems and other postoperative/anesthesia complications. Written informed consent was obtained.    FINDINGS:    External genitalia, vaginal canal and cervix negative for lesions. Intraoperative findings revealed a normal upper abdomen including bowel, liver,  diaphragmatic surfaces, stomach, and omentum.   The uterus was significantly enlarged and laterally along the pelvis.  The right and left ovaries appeared normal.  Bilateral tubes appeared normal.  Appendix within normal limits  The uterus did fill the pelvis laterally, right more than left.  The right side invaded the broad ligament and fibroids in this area invaded the pelvic sidewall at the level of the ureter.  The ureter was carefully dissected out from the pelvic brim until it was visualized peristalsing well lateral to the fibroids.  Old endometriosis scarring was noted throughout the right side peritoneum.  There was also scarring between the bladder and the anterior cervix.  The posterior cul-de-sac was free from endometriosis and Allen-Masters windows.   ANESTHESIA:    General INTRAVENOUS FLUIDS: 1300 ml ESTIMATED BLOOD LOSS: 20 ml URINE OUTPUT: 150 ml  SPECIMENS: Uterus, cervix, bilateral fallopian tubes and many many fibroids Uterine weight was 380g in the operating room  COMPLICATIONS: None immediate   RATLH/BS:  PROCEDURE IN DETAIL: After informed consent was obtained, the patient was taken to the operating room where general anesthesia was obtained without difficulty. The patient was positioned in the dorsal lithotomy position in [place] stirrups and her arms were carefully tucked at her sides and the usual precautions were taken. Deep Trendelenburg (20-25 deg) was established to confirm that she does not shift on the table.  She was prepped and draped in normal sterile fashion.  Time-out was performed and a Foley catheter was placed into the bladder. A standard VCare uterine manipulator was then placed in the uterus without incident.  Preoperative prophylactic antibiotics were given through her iv.  After infiltration of local anesthetic at the proposed trocar sites, an 8 mm incision was created at the umbilicus point, and an AirSeal 5mm was placed under direct  visualization, after confirmation of OG tube working  well. Pneumoperitoneum was created to a pressure of 15 mm Hg. The camera was placed and the abdomin surveyed, noting intact bowel below the site of entry. A survey of the pelvis and upper abdomen revealed the above findings. One right and one left lateral 8-mm robotic ports were placed under direct visualization.  The patient was placed in deepTrendelenburg and the bowel was displaced up into the upper abdomen. The robot was left side docked. The instruments were placed under direct visualization.   The ureters were identified bilaterally coursing outside of the operative field on the left side.   The anterior bladder flap was evaluated initially, and found to have endometriosis scarring.  This was carefully dissected out to reveal the anterior curve of the proposed colpotomy.  The posterior curve of the colpotomy was also evaluated at this time, and tagged for future anatomic reference.   On the right, the round ligament was divided on each side with the EndoShears and the retroperitoneal space was opened bilaterally.  This space was carried down to the level of the ureter, finding it peristalsing on the medial leaflet of the broad ligament.  It was also carried down to the uterine artery and around the fibroid and the space laterally.  The posterior leaflet of the broad was taken down to the level of the IP ligament.   After full evaluation, these vascular pedicles were left intact and attention turned to the left side. On the left, the round ligament was divided and the uterine ovarian ligament was doubly cauterized and cut, freeing the left ovary laterally.  The dissection of the broad was taken down to the anterior colpotomy site, and the uterine arteries skeletonized on the left.  Being careful to avoid the ureter on the left, these arteries were cauterized fully, controlling blood flow to the uterus.  Attention was returned to the right side,  and these vessels were carefully skeletonized as much as possible and taken down very close to the uterus.  The vascular pedicles were allowed to fall away laterally, further moving the ureter away from this area.  Once blood was fully controlled at the vascular pedicles, the colpotomy was performed circumferentially along the V-Care ring with monopolar electrocautery and the cervix was incised from the vagina using the laparoscopic scissors.   A large Endo Catch bag was then placed vaginally and the specimen dropped into it.  This was stored in the pelvis and 500 mg of metronidazole was added to the preoperative antibiotics.    A pneumo balloon was placed in the vagina and the vaginal cuff was then closed in a running continuous fashion using the  0 V-Lock suture with careful attention to include the vaginal cuff angles, the uterosacral ligaments and the vaginal mucosa within the closure.  Attention was then turned to the umbilicus, and the Endo Catch bag was brought through the umbilical incision.  This was extended for a full 3 cm, and the bag brought externally.  A tiny Alexis was placed in the bag and the specimen was manually morcellated using the excite technique.  The fascia at this site was closed, and the camera reintroduced to evaluate the pelvis.  Hemostasis was secured with intraabdominal pressure and review of all surgical sites. The intraperitoneal pressure was dropped, and all planes of dissection, vascular pedicles and the vaginal cuff were found to be hemostatic.  3 g of Arista were placed on the right side where the largest dissection was accomplished.  The robot was  undocked.   Attention was turned to the bladder and cystoscopy showed vigorous bilateral ureteral jets.  No stitches were visualized in the bladder during cystoscopy.  Both 10 mg of Lasix and fluorescein was used to confirm bladder and ureteral integrity  The lateral trocars were removed under visualization.  The  CO2 gas was released and several deep breaths given to remove any remaining CO2 from the peritoneal cavity.  The skin incisions were closed with 4-0 Monocryl subcuticular stitch and pressure dressings.  Dermabond was placed on the umbilical incision under the pressure dressing.   Redosing the Ancef to place just at the end of the case.  The patient tolerated these procedures well.   Anesthesia was reversed without difficulty.  Sponge, lap and needle counts were correct x2.  The patient was taken to recovery room in excellent condition.  A thumb drive was given to the patient with surgical pictures on it.  Anticipate her being discharged today.

## 2023-08-25 NOTE — Discharge Instructions (Addendum)
 Discharge instructions after  robotically-assisted total laparoscopic hysterectomy   For the next three days, take ibuprofen and acetaminophen on a schedule, every 8 hours. You can take them together or you can intersperse them, and take one every four hours. I also gave you gabapentin for nighttime, to help you sleep and also to control pain. Take gabapentin medicines at night for at least the next 3 nights. You also have a narcotic, oxycodone, to take as needed if the above medicines don't help.  Postop constipation is a major cause of pain. Stay well hydrated, walk as you tolerate, and take over the counter senna as well as stool softeners if you need them.  Start your chronic constipation medication tonight or tomorrow morning.  Constipation is painful, and I have included some information below to help make it better postoperatively.  I used a bright yellow dye to show me where your ureters and bladder were.  Your urine is likely to be unusually colored while that flushes through your system, but this will resolve on its own.   Signs and Symptoms to Report Call our office at [phone] if you have any of the following.   Fever over 100.4 degrees or higher  Severe stomach pain not relieved with pain medications  Bright red bleeding that's heavier than a period that does not slow with rest  To go the bathroom a lot (frequency), you can't hold your urine (urgency), or it hurts when you empty your bladder (urinate)  Chest pain  Shortness of breath  Pain in the calves of your legs  Severe nausea and vomiting not relieved with anti-nausea medications  Signs of infection around your wounds, such as redness, hot to touch, swelling, green/yellow drainage (like pus), bad smelling discharge  Any concerns  What You Can Expect after Surgery  You may see some pink tinged, bloody fluid and bruising around the wound. This is normal.  You may notice shoulder and neck pain. This is caused by the  gas used during surgery to expand your abdomen so your surgeon could get to the uterus easier.  You may have a sore throat because of the tube in your mouth during general anesthesia. This will go away in 2 to 3 days.  You may have some stomach cramps.  You may notice spotting on your panties.  You may have pain around the incision sites.   Activities after Your Discharge Follow these guidelines to help speed your recovery at home:  Do the coughing and deep breathing as you did in the hospital for 2 weeks. Use the small blue breathing device, called the incentive spirometer for 2 weeks.  Don't drive if you are in pain or taking narcotic pain medicine. You may drive when you can safely slam on the brakes, turn the wheel forcefully, and rotate your torso comfortably. This is typically 1-2 weeks. Practice in a parking lot or side street prior to attempting to drive regularly.   Ask others to help with household chores for 4 weeks.  Do not lift anything heavier that 10 pounds for 4-6 weeks. This includes pets, children, and groceries.  Don't do strenuous activities, exercises, or sports like vacuuming, tennis, squash, etc. until your doctor says it is safe to do so. ---Maintain pelvic rest for 12 weeks. This means nothing in the vagina or rectum at all (no douching, tampons, intercourse) for 12 weeks.   Walk as you feel able. Rest often since it may take two or three weeks  for your energy level to return to normal.   You may climb stairs  Avoid constipation:   -Eat fruits, vegetables, and whole grains. Eat small meals as your appetite will take time to return to normal.   -Drink 6 to 8 glasses of water each day unless your doctor has told you to limit your fluids.   -Use a laxative or stool softener as needed if constipation becomes a problem. You may take Miralax, metamucil, Citrucil, Colace, Senekot, FiberCon, etc. If this does not relieve the constipation, try two tablespoons of Milk Of Magnesia  every 8 hours until your bowels move.   You may shower. Gently wash the wounds with a mild soap and water. Pat dry.  Do not get in a hot tub, swimming pool, etc. for 6 weeks.  Do not use lotions, oils, powders on the wounds.  Do not douche, use tampons, or have sex until your doctor says it is okay.  Take your pain medicine when you need it. The medicine may not work as well if the pain is bad.  Take the medicines you were taking before surgery. Other medications you will need are pain medications (Norco or Percocet) and nausea medications (Zofran).  Here is a helpful article from the website http://mitchell.org/, regarding constipation  Here are reasons why constipation occurs after surgery: 1) During the operation and in the recovery room, most people are given opioid pain medication, primarily through an IV, to treat moderate or severe pain. Intravenous opioids include morphine, Dilaudid and fentanyl. After surgery, patients are often prescribed opioid pain medication to take by mouth at home, including codeine, Vicodin, Norco, and Percocet. All of these medications cause constipation by slowing down the movement of your intestine. 2) Changes in your diet before surgery can be another culprit. It is common to get specific instructions to change how you normally eat or drink before your surgery, like only having liquids the day before or not having anything to eat or drink after midnight the night before surgery. For this reason, temporary dehydration may occur. This, along with not eating or only having liquids, means that you are getting less fiber than usual. Both these factors contribute to constipation. 3) Changes in your diet after surgery can also contribute to the problem. Although many people don't have dietary restrictions after operations, being under anesthesia can make you lose your appetite for several hours and maybe even days. Some people can even have nausea or vomiting. Not eating or  drinking normally means that you are not getting enough fiber and you can get dehydrated, both leading to constipation. 4) Lying in a bed more than usual--which happens before, during and after surgery--combined with the medications and diet changes, all work together to slow down your colon and make your poop turn to rock.  No one likes to be constipated.  Let's face it, it's not a pleasant feeling when you don't poop for days, then strain on the toilet to finally pass something large enough to cause damage. An ounce of prevention is worth a pound of cure, so: Assume you will be constipated. Plan and prepare accordingly. Post-surgery is one of those unique situations where the temporary use of laxatives can make a world of difference. Always consult with your doctor, and recognize that if you wait several days after surgery to take a laxative, the constipation might be too severe for these over-the-counter options. It is always important to discuss all medications you plan on taking with your  doctor. Ask your doctor if you can start the laxative immediately after surgery. *  Here are go-to post-surgery laxatives: Senna: Senna is an herb that acts as a "stimulant laxative," meaning it increases the activity of the intestine to cause you to have a bowel movement. It comes in many forms, but senna pills are easy to take and are sold over the counter at almost all pharmacies. Since opioid pain medications slow down the activity of the intestine, it makes sense to take a medication to help reverse that side effect. Long-term use of a stimulant laxative is not a good idea since it can make your colon "lazy" and not function properly; however, temporary use immediately after surgery is acceptable. In general, if you are able to eat a normal diet, taking senna soon after surgery works the best. Senna usually works within hours to produce a bowel movement, but this is less predictable when you are taking different  medications after surgery. Try not to wait several days to start taking senna, as often it is too late by then. Just like with all medications or supplements, check with your doctor before starting new treatment.   Magnesium: Magnesium is an important mineral that our body needs. We get magnesium from some foods that we eat, especially foods that are high in fiber such as broccoli, almonds and whole grains. There are also magnesium-based medications used to treat constipation including milk of magnesia (magnesium hydroxide), magnesium citrate and magnesium oxide. They work by drawing water into the intestine, putting it into the class of "osmotic" laxatives. Magnesium products in low doses appear to be safe, but if taken in very large doses, can lead to problems such as irregular heartbeat, low blood pressure and even death. It can also affect other medications you might be taking, therefore it is important to discuss using magnesium with your physician and pharmacist before initiating therapy. Most over-the-counter magnesium laxatives work very well to help with the constipation related to surgery, but sometimes they work too well and lead to diarrhea. Make sure you are somewhere with easy access to a bathroom, just in case.   Bisacodyl: Bisacodyl (generic name) is sold under brand names such as Dulcolax. Much like senna, it is a "stimulant laxative," meaning it makes your intestines move more quickly to push out the stool. This is another good choice to start taking as soon as your doctor says you can take a laxative after surgery. It comes in pill form and as a suppository, which is a good choice for people who cannot or are not allowed to swallow pills. Studies have shown that it works as a laxative, but like most of these medications, you should use this on a short-term basis only.   Enema: Enemas strike fear in many people, but FEAR NOT! It's nowhere near as big a deal as you may think. An enema is just  a way to get some liquid into your rectum by placing a specially designed device through your anus. If you have never done one, it might seem like a painful, unpleasant, uncomfortable, complicated and lengthy procedure. But in reality, it's simple, takes just a few seconds and is highly effective. The small ready-made bottles you buy at the pharmacy are much easier than the hose/large rubber container type. Those recommended positions illustrated in some instructions are generally not necessary to place the enema. It's very similar to the insertion of a tampon, requiring a slight squat. Some extra lubrication on the enema's  tip (or on your anus) will make it a breeze. In certain cases, there is no substitute for a good enema. For example, if someone has not pooped for a few days, the beginning of the poop waiting to come out can become rock hard. Passing that hard stool can lead to much pain and problems like anal fissures. Inserting a little liquid to break up the rock-hard stool will help make its passage much easier. Enemas come with different liquids. Most come with saline, but there are also mineral oil options. You can also use warm water in the reusable enema containers. They all work. But since saline can sometimes be irritating, so try a mineral oil or water enema instead.  Here are commonly recommended constipation medications that do not work well for post-surgery constipation: Docusate: Docusate (generic name) most commonly referred to as Colace (brand name) is not really a laxative, but is classified as a stool softener. Although this medication is commonly prescribed, it is not recommended for several reasons: 1) there is no good medical evidence that it works 2) even if it has an effect, which is very questionable, it is minimal and cannot combat the intestinal slowing caused by the opioid medications. Skip docusate to save money and space in your pillbox for something more effective.  PEG:  Miralax (brand name) is basically a chemical called polyethylene glycol (PEG) and it has gained tremendous popularity as a laxative. This product is an "osmotic laxative" meaning it works by pulling water into the stool, making it softer. This is very similar to the action of natural fiber in foods and supplements. Therefore, the effect seen by this medication is not immediate, causing a bowel movement in a day or more. Is this medication strong enough to battle the constipation related to having an operation? Maybe for some people not prone to constipation. But for most people, other laxatives are better to prevent constipation after surgery.

## 2023-08-25 NOTE — Transfer of Care (Signed)
 Immediate Anesthesia Transfer of Care Note  Patient: Sydney [name]  Procedure(s) Performed: XI ROBOTIC ASSISTED LAPAROSCOPIC HYSTERECTOMY AND SALPINGECTOMY (Bilateral) CYSTOSCOPY  Patient Location: PACU  Anesthesia Type:General  Level of Consciousness: drowsy and patient cooperative  Airway & Oxygen Therapy: Patient Spontanous Breathing and Patient connected to face mask oxygen  Post-op Assessment: Report given to RN, Post -op Vital signs reviewed and stable, and Patient moving all extremities X 4  Post vital signs: Reviewed and stable  Last Vitals:  Vitals Value Taken Time  BP 104/82 08/25/23 1211  Temp    Pulse 87 08/25/23 1215  Resp 24 08/25/23 1215  SpO2 100 % 08/25/23 1215  Vitals shown include unfiled device data.  Last Pain:  Vitals:   08/25/23 0616  TempSrc: Temporal  PainSc: 0-No pain         Complications: No notable events documented.

## 2023-08-25 NOTE — Anesthesia Preprocedure Evaluation (Signed)
 Anesthesia Evaluation  Patient identified by MRN, date of birth, ID band Patient awake    Reviewed: Allergy & Precautions, NPO status , Patient's Chart, lab work & pertinent test results  Airway Mallampati: III  TM Distance: >3 FB Neck ROM: full    Dental  (+) Chipped, Dental Advidsory Given   Pulmonary neg pulmonary ROS   Pulmonary exam normal        Cardiovascular negative cardio ROS Normal cardiovascular exam     Neuro/Psych negative neurological ROS  negative psych ROS   GI/Hepatic Neg liver ROS,GERD  Medicated,,  Endo/Other  negative endocrine ROS    Renal/GU      Musculoskeletal   Abdominal   Peds  Hematology  (+) Blood dyscrasia, anemia   Anesthesia Other Findings Past Medical History: No date: Abnormal bleeding in menstrual cycle No date: Anemia     Comment:  Iron deficiency anemia due to chronic blood loss No date: Fibroids No date: GERD (gastroesophageal reflux disease) No date: Headache     Comment:  migraines No date: IBS (irritable bowel syndrome)  Past Surgical History: No date: COLONOSCOPY 10/14/2011: HYSTEROSCOPY WITH D & C     Comment:  Procedure: DILATATION AND CURETTAGE /HYSTEROSCOPY;                Surgeon: Turner Daniels, MD;  Location: [facility];  Service:               Gynecology;  Laterality: N/A;  WITH TRUCLEAR morcellation 2013: MYOMECTOMY     Comment:  x2 No date: REFRACTIVE SURGERY     Comment:  lasik No date: UPPER GASTROINTESTINAL ENDOSCOPY 08/13/2015: UTERINE FIBROID SURGERY     Comment:  Dr. Rana Snare  BMI    Body Mass Index: 30.73 kg/m      Reproductive/Obstetrics negative OB ROS                              Anesthesia Physical Anesthesia Plan  ASA: 2  Anesthesia Plan: General ETT   Post-op Pain Management:    Induction: Intravenous  PONV Risk Score and Plan: 3 and Ondansetron, Dexamethasone and Midazolam  Airway Management Planned:  Oral ETT  Additional Equipment:   Intra-op Plan:   Post-operative Plan: Extubation in OR  Informed Consent: I have reviewed the patients History and Physical, chart, labs and discussed the procedure including the risks, benefits and alternatives for the proposed anesthesia with the patient or authorized representative who has indicated his/her understanding and acceptance.     Dental Advisory Given  Plan Discussed with: Anesthesiologist, CRNA and Surgeon  Anesthesia Plan Comments: (Patient consented for risks of anesthesia including but not limited to:  - adverse reactions to medications - damage to eyes, teeth, lips or other oral mucosa - nerve damage due to positioning  - sore throat or hoarseness - Damage to heart, brain, nerves, lungs, other parts of body or loss of life  Patient voiced understanding and assent.)         Anesthesia Quick Evaluation

## 2023-08-25 NOTE — Anesthesia Procedure Notes (Signed)
 Procedure Name: Intubation Date/Time: 08/25/2023 7:37 AM  Performed by: Lanell Matar, CRNAPre-anesthesia Checklist: Patient identified, Emergency Drugs available, Suction available and Patient being monitored Patient Re-evaluated:Patient Re-evaluated prior to induction Oxygen Delivery Method: Circle System Utilized Preoxygenation: Pre-oxygenation with 100% oxygen Induction Type: IV induction Ventilation: Mask ventilation without difficulty Laryngoscope Size: McGrath and 4 Grade View: Grade I Tube type: Oral Tube size: 7.0 mm Number of attempts: 1 Airway Equipment and Method: Stylet and Oral airway Placement Confirmation: ETT inserted through vocal cords under direct vision, positive ETCO2 and breath sounds checked- equal and bilateral Secured at: 21 cm Tube secured with: Tape Dental Injury: Teeth and Oropharynx as per pre-operative assessment

## 2023-08-25 NOTE — Anesthesia Postprocedure Evaluation (Signed)
 Anesthesia Post Note  Patient: Millenia Waldvogel [place]  Procedure(s) Performed: XI ROBOTIC ASSISTED LAPAROSCOPIC HYSTERECTOMY AND SALPINGECTOMY (Bilateral) CYSTOSCOPY  Patient location during evaluation: PACU Anesthesia Type: General Level of consciousness: awake Pain management: satisfactory to patient Vital Signs Assessment: post-procedure vital signs reviewed and stable Respiratory status: spontaneous breathing and nonlabored ventilation Cardiovascular status: stable Anesthetic complications: no   No notable events documented.   Last Vitals:  Vitals:   08/25/23 1212 08/25/23 1230  BP:  119/65  Pulse:  88  Resp:  18  Temp: 36.4 C   SpO2:  92%    Last Pain:  Vitals:   08/25/23 0616  TempSrc: Temporal  PainSc: 0-No pain                 VAN STAVEREN,Axil Copeman

## 2023-08-25 NOTE — Interval H&P Note (Signed)
 History and Physical Interval Note:  08/25/2023 7:25 AM  Sydney [name]  has presented today for surgery, with the diagnosis of fibroids, AUB.  The various methods of treatment have been discussed with the patient and family. After consideration of risks, benefits and other options for treatment, the patient has consented to  Procedure(s): XI ROBOTIC ASSISTED LAPAROSCOPIC HYSTERECTOMY AND SALPINGECTOMY (Bilateral) CYSTOSCOPY (N/A) as a surgical intervention.  The patient's history has been reviewed, patient examined, no change in status, stable for surgery.  I have reviewed the patient's chart and labs.  Questions were answered to the patient's satisfaction.     Christeen Douglas

## 2023-08-26 ENCOUNTER — Encounter: Payer: Self-pay | Admitting: Obstetrics and Gynecology

## 2023-08-29 LAB — SURGICAL PATHOLOGY

## 2023-09-11 ENCOUNTER — Other Ambulatory Visit: Payer: Self-pay | Admitting: Internal Medicine

## 2023-09-11 DIAGNOSIS — Z1231 Encounter for screening mammogram for malignant neoplasm of breast: Secondary | ICD-10-CM

## 2023-09-20 ENCOUNTER — Other Ambulatory Visit: Payer: Self-pay | Admitting: Internal Medicine

## 2023-09-20 DIAGNOSIS — K581 Irritable bowel syndrome with constipation: Secondary | ICD-10-CM

## 2023-09-21 NOTE — Telephone Encounter (Signed)
 Requested medication (s) are due for refill today: yes  Requested medication (s) are on the active medication list: yes  Last refill:  10/04/22  Future visit scheduled: no  Notes to clinic:   Medication not assigned to a protocol, review manually      Requested Prescriptions  Pending Prescriptions Disp Refills   TRULANCE  3 MG TABS [Pharmacy Med Name: TRULANCE  3 MG TABLET] 30 tablet 1    Sig: TAKE 1 TABLET BY MOUTH DAILY     Off-Protocol Failed - 09/21/2023 10:29 AM      Failed - Medication not assigned to a protocol, review manually.      Failed - Valid encounter within last 12 months    Recent Outpatient Visits   None

## 2023-09-28 ENCOUNTER — Other Ambulatory Visit: Payer: Self-pay | Admitting: Emergency Medicine

## 2023-09-28 DIAGNOSIS — K581 Irritable bowel syndrome with constipation: Secondary | ICD-10-CM

## 2023-10-02 ENCOUNTER — Ambulatory Visit
Admission: RE | Admit: 2023-10-02 | Discharge: 2023-10-02 | Disposition: A | Discharge status: Home / Self Care | Source: Ambulatory Visit

## 2023-10-02 DIAGNOSIS — Z1231 Encounter for screening mammogram for malignant neoplasm of breast: Secondary | ICD-10-CM

## 2023-10-03 ENCOUNTER — Encounter: Admitting: Internal Medicine

## 2023-10-25 ENCOUNTER — Ambulatory Visit

## 2023-10-26 ENCOUNTER — Encounter: Admitting: Internal Medicine

## 2023-11-06 ENCOUNTER — Encounter: Admitting: Internal Medicine

## 2023-11-09 ENCOUNTER — Other Ambulatory Visit: Payer: Self-pay | Admitting: Emergency Medicine

## 2023-11-09 ENCOUNTER — Telehealth: Payer: Self-pay | Admitting: Internal Medicine

## 2023-11-09 DIAGNOSIS — K581 Irritable bowel syndrome with constipation: Secondary | ICD-10-CM

## 2023-11-09 NOTE — Telephone Encounter (Signed)
Order sent for refill

## 2023-11-09 NOTE — Telephone Encounter (Signed)
 Plecanatide  (TRULANCE ) 3 MG TABS   Requesting 3 refills

## 2023-11-13 ENCOUNTER — Other Ambulatory Visit: Payer: Self-pay | Admitting: Internal Medicine

## 2023-11-13 DIAGNOSIS — Z1231 Encounter for screening mammogram for malignant neoplasm of breast: Secondary | ICD-10-CM

## 2023-11-15 NOTE — Progress Notes (Unsigned)
 Name: Sydney [name]   MRN: [id number]    DOB: 09/06/1970   Date:11/16/2023       Progress Note  Subjective  Chief Complaint  Chief Complaint  Patient presents with   Annual Exam    HPI  Patient presents for annual CPE and follow up for refills.  Discussed the use of AI scribe software for clinical note transcription with the patient, who gave verbal consent to proceed.  History of Present Illness    She requires a refill of Trulance  for IBS management. Her previous gastroenterologist is unavailable, and her insurance, [employer], prefers mail-order prescriptions, though she currently uses [facility].  She has lost weight, from 194 to 163 pounds over the past year, through working with a Systems analyst and using a home gym. She is focused on a healthier lifestyle and feels positive about her progress.  She underwent a hysterectomy on August 25, 2023, with a smooth recovery. Iron  deficiency anemia due to chronic blood loss has resolved post-surgery.  Current medications include Trulance  and an estradiol patch. She requests a vitamin D  level assessment, as it has not been checked since 2021.  Diet: Regular Exercise:2 times a week 60 minutes - working with Systems analyst  Last Eye Exam: completed Last Dental Exam: completed  Flowsheet Row Office Visit from 11/16/2023 in [facility]  AUDIT-C Score 3   Depression: Phq 9 is  negative    11/16/2023    8:00 AM 12/05/2022    9:52 AM 11/03/2022    9:55 AM 10/04/2022    1:55 PM 10/19/2021    9:43 AM  Depression screen PHQ 2/9  Decreased Interest 0 0 0 0 0  Down, Depressed, Hopeless 0 0 0 0 0  PHQ - 2 Score 0 0 0 0 0  Altered sleeping  0  0   Tired, decreased energy  0  0   Change in appetite  0  0   Feeling bad or failure about yourself   0  0   Trouble concentrating  0  0   Moving slowly or fidgety/restless  0  0   Suicidal thoughts  0  0   PHQ-9 Score  0  0   Difficult doing work/chores  Not difficult  at all  Not difficult at all    Hypertension: BP Readings from Last 3 Encounters:  11/16/23 122/74  08/25/23 (!) 143/83  12/05/22 124/82   Obesity: Wt Readings from Last 3 Encounters:  11/16/23 163 lb 1.6 oz (74 kg)  08/25/23 168 lb (76.2 kg)  08/17/23 168 lb (76.2 kg)   BMI Readings from Last 3 Encounters:  11/16/23 29.83 kg/m  08/25/23 30.73 kg/m  08/17/23 30.73 kg/m     Vaccines: UTD reviewed with the patient.   Hep C Screening: completed STD testing and prevention (HIV/chl/gon/syphilis): no concerns Intimate partner violence: negative screen  Sexual History : active Menstrual History/LMP/Abnormal Bleeding:  Hysterectomy in March, no issues since then Discussed importance of follow up if any post-menopausal bleeding: no  Incontinence Symptoms: negative for symptoms   Breast cancer:  - Last Mammogram: 09/12/2021, scheduled for Monday  Osteoporosis Prevention : Discussed high calcium and vitamin D  supplementation, weight bearing exercises Bone density :not applicable   Cervical cancer screening: up-to-date 10/12/2022  Skin cancer: Discussed monitoring for atypical lesions  Colorectal cancer: 01/07/2022   Lung cancer:  Low Dose CT Chest recommended if Age 78-80 years, 20 pack-year currently smoking OR have quit w/in 15years. Patient does  not qualify for screen     Advanced Care Planning: A voluntary discussion about advance care planning including the explanation and discussion of advance directives.  Discussed health care proxy and Living will, and the patient was able to identify a health care proxy as Sydney [name] [name] (husband).  Patient does not have a living will and power of attorney of health care   Patient Active Problem List   Diagnosis Date Noted   Chronic pain of right knee 07/01/2022   Iron  deficiency anemia due to chronic blood loss 10/20/2021   Class 1 obesity with body mass index (BMI) of 33.0 to 33.9 in adult 05/08/2019   Perimenopausal 04/01/2019    Ganglion cyst of dorsum of right wrist 04/01/2019   Anemia 03/29/2019   IBS (irritable bowel syndrome) 10/15/2013   Vitamin D  deficiency 10/15/2013   Migraine 08/30/2007   SLOW TRANSIT CONSTIPATION 08/30/2007    Past Surgical History:  Procedure Laterality Date   COLONOSCOPY     CYSTOSCOPY N/A 08/25/2023   Procedure: CYSTOSCOPY;  Surgeon: Sydney Brod, MD;  Location: [facility];  Service: Gynecology;  Laterality: N/A;   HYSTEROSCOPY WITH D & C  10/14/2011   Procedure: DILATATION AND CURETTAGE /HYSTEROSCOPY;  Surgeon: Sydney Finner, MD;  Location: [facility];  Service: Gynecology;  Laterality: N/A;  WITH TRUCLEAR morcellation   MYOMECTOMY  2013   x2   REFRACTIVE SURGERY     lasik   ROBOTIC ASSISTED LAPAROSCOPIC HYSTERECTOMY AND SALPINGECTOMY Bilateral 08/25/2023   Procedure: XI ROBOTIC ASSISTED LAPAROSCOPIC HYSTERECTOMY AND SALPINGECTOMY;  Surgeon: Sydney Brod, MD;  Location: [facility];  Service: Gynecology;  Laterality: Bilateral;   UPPER GASTROINTESTINAL ENDOSCOPY     UTERINE FIBROID SURGERY  08/13/2015   Dr. Asencion [name]    Family History  Problem Relation Age of Onset   Diabetes Mother    Hypertension Mother    Prostate cancer Paternal Uncle    Cancer Paternal Uncle    Heart disease Maternal Grandmother    Hypertension Paternal Grandmother    Stomach cancer Paternal Grandfather    Hypertension Paternal Grandfather    Cancer Paternal Grandfather    Colon cancer Neg Hx    Colon polyps Neg Hx    Esophageal cancer Neg Hx    Rectal cancer Neg Hx     Social History   Socioeconomic History   Marital status: Married    Spouse name: Sydney [name]   Number of children: 0   Years of education: Associates in Respiratory Therapy   Highest education level: Associate degree: academic program  Occupational History   Not on file  Tobacco Use   Smoking status: Never    Passive exposure: Never   Smokeless tobacco: Never  Vaping Use   Vaping status: Never Used  Substance and Sexual Activity    Alcohol use: Yes    Comment: wine - twice  a week   Drug use: No   Sexual activity: Yes    Birth control/protection: None  Other Topics Concern   Not on file  Social History Narrative   02/19/20   From: the area   Living: with husband, Sydney [name] (2015)   Work: Buyer, retail at [employer]      Family: mom is nearby, good relationship      Enjoys: travel, spend time with friends      Exercise: walking the block   Diet: meal prep at home - salads, grilled food, limits fried food      Safety   Seat belts: Yes  Guns: Yes  and secure   Safe in relationships: Yes    Social Drivers of Corporate investment banker Strain: Low Risk  (11/12/2023)   Overall Financial Resource Strain (CARDIA)    Difficulty of Paying Living Expenses: Not hard at all  Food Insecurity: No Food Insecurity (11/12/2023)   Hunger Vital Sign    Worried About Running Out of Food in the Last Year: Never true    Ran Out of Food in the Last Year: Never true  Transportation Needs: No Transportation Needs (11/12/2023)   PRAPARE - Administrator, Civil Service (Medical): No    Lack of Transportation (Non-Medical): No  Physical Activity: Sufficiently Active (11/12/2023)   Exercise Vital Sign    Days of Exercise per Week: 3 days    Minutes of Exercise per Session: 60 min  Stress: No Stress Concern Present (11/12/2023)   [employer] of Occupational Health - Occupational Stress Questionnaire    Feeling of Stress: Not at all  Social Connections: Moderately Integrated (11/12/2023)   Social Connection and Isolation Panel    Frequency of Communication with Friends and Family: More than three times a week    Frequency of Social Gatherings with Friends and Family: More than three times a week    Attends Religious Services: Patient declined    Database administrator or Organizations: Yes    Attends Engineer, structural: More than 4 times per year    Marital Status: Married  Catering manager  Violence: Not At Risk (11/03/2022)   Humiliation, Afraid, Rape, and Kick questionnaire    Fear of Current or Ex-Partner: No    Emotionally Abused: No    Physically Abused: No    Sexually Abused: No     Current Outpatient Medications:    aspirin-acetaminophen -caffeine (EXCEDRIN MIGRAINE) 250-250-65 MG tablet, Take 1 tablet by mouth every 6 (six) hours as needed for headache., Disp: , Rfl:    cetirizine (ZYRTEC) 10 MG tablet, Take 10 mg by mouth daily as needed for allergies., Disp: , Rfl:    estradiol (VIVELLE-DOT) 0.1 MG/24HR patch, Place 1 patch onto the skin 2 (two) times a week., Disp: , Rfl:    famotidine (ZANTAC 360 MAX ST) 20 MG tablet, Take 20 mg by mouth daily as needed for heartburn or indigestion., Disp: , Rfl:    ibuprofen (ADVIL) 200 MG tablet, Take 800 mg by mouth every 6 (six) hours as needed for moderate pain (pain score 4-6)., Disp: , Rfl:    Multiple Vitamin (MULTIVITAMIN WITH MINERALS) TABS tablet, Take 1 tablet by mouth 3 (three) times a week., Disp: , Rfl:    Plecanatide  (TRULANCE ) 3 MG TABS, Take 1 tablet (3 mg total) by mouth daily., Disp: 30 tablet, Rfl: 1   pseudoephedrine (SUDAFED) 30 MG tablet, Take 60 mg by mouth every 4 (four) hours as needed for congestion., Disp: , Rfl:    Simethicone (GAS RELIEF) 250 MG CAPS, Take 500 mg by mouth daily as needed (gas)., Disp: , Rfl:    acetaminophen  (TYLENOL ) 500 MG tablet, Take 1,000 mg by mouth every 6 (six) hours as needed., Disp: , Rfl:    docusate sodium  (COLACE) 100 MG capsule, Take 1 capsule (100 mg total) by mouth 2 (two) times daily. To keep stools soft, Disp: 30 capsule, Rfl: 0   gabapentin  (NEURONTIN ) 300 MG capsule, Take 1 capsule (300 mg total) by mouth at bedtime for 14 days. Take nightly for three days after surgery, and then as  needed for pain for 14 days., Disp: 14 capsule, Rfl: 0   ibuprofen (ADVIL) 800 MG tablet, Take 200-600 mg by mouth 3 (three) times daily as needed for mild pain (pain score 1-3). (Patient not  taking: Reported on 11/16/2023), Disp: , Rfl:    oxyCODONE  (OXY IR/ROXICODONE ) 5 MG immediate release tablet, Take 1 tablet (5 mg total) by mouth every 4 (four) hours as needed for severe pain (pain score 7-10)., Disp: 20 tablet, Rfl: 0  No Known Allergies   Review of Systems  All other systems reviewed and are negative.   Objective  Vitals:   11/16/23 0802  BP: 122/74  Pulse: 99  Resp: 16  Temp: 97.9 F (36.6 C)  TempSrc: Oral  SpO2: 98%  Weight: 163 lb 1.6 oz (74 kg)  Height: 5' 2 (1.575 m)    Body mass index is 29.83 kg/m.  Physical Exam Constitutional:      Appearance: Normal appearance.  HENT:     Head: Normocephalic and atraumatic.     Mouth/Throat:     Mouth: Mucous membranes are moist.     Pharynx: Oropharynx is clear.   Eyes:     Extraocular Movements: Extraocular movements intact.     Conjunctiva/sclera: Conjunctivae normal.     Pupils: Pupils are equal, round, and reactive to light.   Neck:     Comments: No thyromegaly Cardiovascular:     Rate and Rhythm: Normal rate and regular rhythm.  Pulmonary:     Effort: Pulmonary effort is normal.     Breath sounds: Normal breath sounds.   Musculoskeletal:     Cervical back: No tenderness.     Right lower leg: No edema.     Left lower leg: No edema.  Lymphadenopathy:     Cervical: No cervical adenopathy.   Skin:    General: Skin is warm and dry.   Neurological:     General: No focal deficit present.     Mental Status: She is alert. Mental status is at baseline.   Psychiatric:        Mood and Affect: Mood normal.        Behavior: Behavior normal.     Last CBC Lab Results  Component Value Date   WBC 5.7 08/17/2023   HGB 9.2 (L) 08/25/2023   HCT 27.0 (L) 08/25/2023   MCV 78.1 (L) 08/17/2023   MCH 24.7 (L) 08/17/2023   RDW 14.0 08/17/2023   PLT 333 08/17/2023   Last metabolic panel Lab Results  Component Value Date   GLUCOSE 89 08/25/2023   NA 140 08/25/2023   K 3.9 08/25/2023   CL  108 08/25/2023   CO2 24 11/03/2022   BUN 10 08/25/2023   CREATININE 1.00 08/25/2023   EGFR 64 11/03/2022   CALCIUM 9.6 11/03/2022   PROT 7.2 11/03/2022   ALBUMIN 4.0 10/19/2021   BILITOT 0.2 11/03/2022   ALKPHOS 80 10/19/2021   AST 20 11/03/2022   ALT 16 11/03/2022   Last lipids Lab Results  Component Value Date   CHOL 179 11/03/2022   HDL 86 11/03/2022   LDLCALC 80 11/03/2022   TRIG 57 11/03/2022   CHOLHDL 2.1 11/03/2022   Last hemoglobin A1c Lab Results  Component Value Date   HGBA1C 5.7 (A) 10/04/2022   Last thyroid  functions Lab Results  Component Value Date   TSH 2.60 10/19/2021   Last vitamin D  Lab Results  Component Value Date   VD25OH 44.02 04/09/2020   Last vitamin B12 and  Folate Lab Results  Component Value Date   VITAMINB12 352 04/09/2020      Assessment & Plan  Assessment & Plan  Irritable Bowel Syndrome (IBS) IBS managed with Trulance . Requires refill and insurance coordination for cost-effective mail-order prescription. - Prescribe Trulance  for one year. - Coordinate with pharmacy and insurance for mail-order prescription if required.  Vitamin D  Deficiency Vitamin D  levels last checked in 2021. Recheck planned as part of routine labs. - Order vitamin D  level as part of lab work.  Post-Hysterectomy Status Post-hysterectomy with smooth recovery. No Pap smears needed.  General Health Maintenance Regular exercise with BMI reduced to 29. Routine labs and mammogram scheduled. Started on estradiol patch. - Order lab work including CBC, kidney function, liver function, electrolytes, cholesterol, and A1c. - Ensure mammogram is completed. - Monitor cholesterol levels due to estradiol patch use.  - Plecanatide  (TRULANCE ) 3 MG TABS; Take 1 tablet (3 mg total) by mouth daily.  Dispense: 90 tablet; Refill: 3 - CBC w/Diff/Platelet - Comprehensive Metabolic Panel (CMET) - Lipid Profile - HgB A1c - Vitamin D  (25 hydroxy)   -USPSTF grade A and B  recommendations reviewed with patient; age-appropriate recommendations, preventive care, screening tests, etc discussed and encouraged; healthy living encouraged; see AVS for patient education given to patient -Discussed importance of 150 minutes of physical activity weekly, eat two servings of fish weekly, eat one serving of tree nuts ( cashews, pistachios, pecans, almonds.Aaron Aas) every other day, eat 6 servings of fruit/vegetables daily and drink plenty of water and avoid sweet beverages.   -Reviewed Health Maintenance: Yes.

## 2023-11-16 ENCOUNTER — Ambulatory Visit (INDEPENDENT_AMBULATORY_CARE_PROVIDER_SITE_OTHER): Admitting: Internal Medicine

## 2023-11-16 ENCOUNTER — Other Ambulatory Visit: Payer: Self-pay

## 2023-11-16 ENCOUNTER — Encounter: Payer: Self-pay | Admitting: Internal Medicine

## 2023-11-16 VITALS — BP 122/74 | HR 99 | Temp 97.9°F | Resp 16 | Ht 62.0 in | Wt 163.1 lb

## 2023-11-16 DIAGNOSIS — K581 Irritable bowel syndrome with constipation: Secondary | ICD-10-CM

## 2023-11-16 DIAGNOSIS — Z0000 Encounter for general adult medical examination without abnormal findings: Secondary | ICD-10-CM

## 2023-11-16 DIAGNOSIS — E559 Vitamin D deficiency, unspecified: Secondary | ICD-10-CM | POA: Diagnosis not present

## 2023-11-16 DIAGNOSIS — R7303 Prediabetes: Secondary | ICD-10-CM

## 2023-11-16 DIAGNOSIS — Z13220 Encounter for screening for lipoid disorders: Secondary | ICD-10-CM | POA: Diagnosis not present

## 2023-11-16 MED ORDER — TRULANCE 3 MG PO TABS
3.0000 mg | ORAL_TABLET | Freq: Every day | ORAL | 3 refills | Status: DC
Start: 2023-11-16 — End: 2024-01-05

## 2023-11-17 ENCOUNTER — Ambulatory Visit: Payer: Self-pay | Admitting: Internal Medicine

## 2023-11-17 DIAGNOSIS — E559 Vitamin D deficiency, unspecified: Secondary | ICD-10-CM

## 2023-11-17 LAB — COMPREHENSIVE METABOLIC PANEL WITH GFR
AG Ratio: 1.4 (calc) (ref 1.0–2.5)
ALT: 9 U/L (ref 6–29)
AST: 14 U/L (ref 10–35)
Albumin: 4.1 g/dL (ref 3.6–5.1)
Alkaline phosphatase (APISO): 87 U/L (ref 37–153)
BUN: 16 mg/dL (ref 7–25)
CO2: 27 mmol/L (ref 20–32)
Calcium: 9.1 mg/dL (ref 8.6–10.4)
Chloride: 104 mmol/L (ref 98–110)
Creat: 0.89 mg/dL (ref 0.50–1.03)
Globulin: 2.9 g/dL (ref 1.9–3.7)
Glucose, Bld: 76 mg/dL (ref 65–99)
Potassium: 5 mmol/L (ref 3.5–5.3)
Sodium: 136 mmol/L (ref 135–146)
Total Bilirubin: 0.3 mg/dL (ref 0.2–1.2)
Total Protein: 7 g/dL (ref 6.1–8.1)
eGFR: 77 mL/min/{1.73_m2} (ref >=60)

## 2023-11-17 LAB — CBC WITH DIFFERENTIAL/PLATELET
Absolute Lymphocytes: 1672 {cells}/uL (ref 850–3900)
Absolute Monocytes: 228 {cells}/uL (ref 200–950)
Basophils Absolute: 40 {cells}/uL (ref 0–200)
Basophils Relative: 1 %
Eosinophils Absolute: 0 {cells}/uL — ABNORMAL LOW (ref 15–500)
Eosinophils Relative: 0 %
HCT: 34.7 % — ABNORMAL LOW (ref 35.0–45.0)
Hemoglobin: 10 g/dL — ABNORMAL LOW (ref 11.7–15.5)
MCH: 23.3 pg — ABNORMAL LOW (ref 27.0–33.0)
MCHC: 28.8 g/dL — ABNORMAL LOW (ref 32.0–36.0)
MCV: 80.7 fL (ref 80.0–100.0)
MPV: 10 fL (ref 7.5–12.5)
Monocytes Relative: 5.7 %
Neutro Abs: 2060 {cells}/uL (ref 1500–7800)
Neutrophils Relative %: 51.5 %
Platelets: 292 10*3/uL (ref 140–400)
RBC: 4.3 10*6/uL (ref 3.80–5.10)
RDW: 15.9 % — ABNORMAL HIGH (ref 11.0–15.0)
Total Lymphocyte: 41.8 %
WBC: 4 10*3/uL (ref 3.8–10.8)

## 2023-11-17 LAB — HEMOGLOBIN A1C
Hgb A1c MFr Bld: 5.5 % (ref <5.7)
Mean Plasma Glucose: 111 mg/dL
eAG (mmol/L): 6.2 mmol/L

## 2023-11-17 LAB — LIPID PANEL
Cholesterol: 189 mg/dL (ref <200)
HDL: 97 mg/dL (ref >=50)
LDL Cholesterol (Calc): 78 mg/dL
Non-HDL Cholesterol (Calc): 92 mg/dL (ref <130)
Total CHOL/HDL Ratio: 1.9 (calc) (ref <5.0)
Triglycerides: 63 mg/dL (ref <150)

## 2023-11-17 LAB — VITAMIN D 25 HYDROXY (VIT D DEFICIENCY, FRACTURES): Vit D, 25-Hydroxy: 29 ng/mL — ABNORMAL LOW (ref 30–100)

## 2023-11-17 MED ORDER — VITAMIN D (ERGOCALCIFEROL) 1.25 MG (50000 UNIT) PO CAPS: 50000.0000 [IU] | ORAL_CAPSULE | ORAL | 0 refills | Status: DC

## 2023-11-20 ENCOUNTER — Encounter

## 2023-11-20 ENCOUNTER — Other Ambulatory Visit: Payer: Self-pay | Admitting: Internal Medicine

## 2023-11-20 DIAGNOSIS — Z1231 Encounter for screening mammogram for malignant neoplasm of breast: Secondary | ICD-10-CM

## 2023-11-23 ENCOUNTER — Encounter: Payer: Self-pay | Admitting: Internal Medicine

## 2023-11-29 ENCOUNTER — Ambulatory Visit
Admission: RE | Admit: 2023-11-29 | Discharge: 2023-11-29 | Disposition: A | Discharge status: Home / Self Care | Source: Ambulatory Visit

## 2023-11-29 DIAGNOSIS — Z1231 Encounter for screening mammogram for malignant neoplasm of breast: Secondary | ICD-10-CM

## 2023-12-04 ENCOUNTER — Ambulatory Visit: Payer: Self-pay | Admitting: Internal Medicine

## 2023-12-28 ENCOUNTER — Telehealth: Payer: Self-pay | Admitting: Internal Medicine

## 2023-12-28 NOTE — Telephone Encounter (Signed)
 Plecanatide  (TRULANCE ) 3 MG TABS  90 day supply with 3 refills

## 2023-12-29 NOTE — Telephone Encounter (Signed)
 90 day supply with 3 refills was sent on 06/19

## 2024-01-05 ENCOUNTER — Encounter: Payer: Self-pay | Admitting: Internal Medicine

## 2024-01-05 DIAGNOSIS — K581 Irritable bowel syndrome with constipation: Secondary | ICD-10-CM

## 2024-01-05 MED ORDER — TRULANCE 3 MG PO TABS: 3.0000 mg | ORAL_TABLET | Freq: Every day | ORAL | 3 refills | Status: DC

## 2024-01-11 ENCOUNTER — Other Ambulatory Visit: Payer: Self-pay

## 2024-01-11 DIAGNOSIS — K581 Irritable bowel syndrome with constipation: Secondary | ICD-10-CM

## 2024-01-11 MED ORDER — TRULANCE 3 MG PO TABS: 3.0000 mg | ORAL_TABLET | Freq: Every day | ORAL | 3 refills | Status: AC

## 2024-02-08 ENCOUNTER — Other Ambulatory Visit: Payer: Self-pay | Admitting: Internal Medicine

## 2024-02-08 DIAGNOSIS — E559 Vitamin D deficiency, unspecified: Secondary | ICD-10-CM

## 2024-02-08 NOTE — Telephone Encounter (Signed)
 Requested medications are due for refill today.  yes  Requested medications are on the active medications list.  yes  Last refill. 11/17/2023 #12 0 rf  Future visit scheduled.   no  Notes to clinic.  Provider to review at this dosage.    Requested Prescriptions  Pending Prescriptions Disp Refills   Vitamin D , Ergocalciferol , (DRISDOL ) 1.25 MG (50000 UNIT) CAPS capsule [Pharmacy Med Name: VITAMIN D2 1.25MG (50,000 UNIT)] 12 capsule 0    Sig: Take 1 capsule (50,000 Units total) by mouth every 7 (seven) days.     Endocrinology:  Vitamins - Vitamin D  Supplementation 2 Failed - 02/08/2024  4:14 PM      Failed - Manual Review: Route requests for 50,000 IU strength to the provider      Failed - Vitamin D  in normal range and within 360 days    VITD  Date Value Ref Range Status  04/09/2020 44.02 30.00 - 100.00 ng/mL Final   Vit D, 25-Hydroxy  Date Value Ref Range Status  11/16/2023 29 (L) 30 - 100 ng/mL Final    Comment:    Vitamin D  Status         25-OH Vitamin D : . Deficiency:                    <20 ng/mL Insufficiency:             20 - 29 ng/mL Optimal:                 > or = 30 ng/mL . For 25-OH Vitamin D  testing on patients on  D2-supplementation and patients for whom quantitation  of D2 and D3 fractions is required, the [facility](TM) 25-OH VIT D, (D2,D3), LC/MS/MS is recommended: order  code 07111 (patients >90yrs). . See Note 1 . Note 1 . For additional information, please refer to  http://education.[facility].com/faq/FAQ199  (This link is being provided for informational/ educational purposes only.)          Passed - Ca in normal range and within 360 days    Calcium  Date Value Ref Range Status  11/16/2023 9.1 8.6 - 10.4 mg/dL Final   Calcium, Ion  Date Value Ref Range Status  08/25/2023 1.21 1.15 - 1.40 mmol/L Final         Passed - Valid encounter within last 12 months    Recent Outpatient Visits           2 months ago Annual physical exam    [facility] [place]

## 2024-03-13 ENCOUNTER — Ambulatory Visit (INDEPENDENT_AMBULATORY_CARE_PROVIDER_SITE_OTHER)

## 2024-03-13 ENCOUNTER — Ambulatory Visit: Payer: Self-pay | Admitting: Podiatry

## 2024-03-13 VITALS — Ht 62.0 in | Wt 163.0 lb

## 2024-03-13 DIAGNOSIS — M79672 Pain in left foot: Secondary | ICD-10-CM

## 2024-03-13 DIAGNOSIS — M79671 Pain in right foot: Secondary | ICD-10-CM | POA: Diagnosis not present

## 2024-03-13 DIAGNOSIS — M722 Plantar fascial fibromatosis: Secondary | ICD-10-CM

## 2024-03-13 DIAGNOSIS — B353 Tinea pedis: Secondary | ICD-10-CM | POA: Diagnosis not present

## 2024-03-13 DIAGNOSIS — M2142 Flat foot [pes planus] (acquired), left foot: Secondary | ICD-10-CM

## 2024-03-13 DIAGNOSIS — M2141 Flat foot [pes planus] (acquired), right foot: Secondary | ICD-10-CM

## 2024-03-13 MED ORDER — MELOXICAM 15 MG PO TABS: 15.0000 mg | ORAL_TABLET | Freq: Every day | ORAL | 3 refills | Status: AC

## 2024-03-13 MED ORDER — CLOTRIMAZOLE-BETAMETHASONE 1-0.05 % EX CREA: 1.0000 | TOPICAL_CREAM | Freq: Every day | CUTANEOUS | 0 refills | Status: DC

## 2024-03-13 NOTE — Patient Instructions (Signed)
 VISIT SUMMARY: Today, you were seen for chronic heel pain that has been troubling you for over six months. The pain is primarily in the back of your heel and is worse on the right side, especially in the morning. You have tried changing shoes and insoles without much relief. Your job requires you to be on your feet for long periods, which may be contributing to your symptoms. You also mentioned having flat feet and difficulty wearing heels, as well as callous and irritation on one foot that has not responded to over-the-counter treatments.  YOUR PLAN: -BILATERAL PLANTAR FASCIITIS: Plantar fasciitis is inflammation of the tissue along the bottom of your foot that connects your heel bone to your toes. To help reduce the inflammation and pain, a steroid injection (cortisone) was administered to your right heel. You are also prescribed meloxicam , an anti-inflammatory medication, to take orally. It is important to perform physical therapy exercises at home, ideally with the help of a personal trainer. Wearing supportive shoes and rotating them regularly is advised, and obtaining custom insoles for additional arch support is recommended.  -BILATERAL PES PLANUS WITH GASTROCNEMIUS EQUINUS: Pes planus, or flat feet, along with tight calf muscles (gastrocnemius equinus), can increase strain on the plantar fascia, contributing to your heel pain. Custom insoles are recommended to provide better arch support and reduce strain. Additionally, stretching exercises for your calf muscles are advised to help alleviate the tightness.  -MOCCASIN DISTRIBUTION TINEA PEDIS, LEFT FOOT: Tinea pedis, commonly known as athlete's foot, is a fungal infection that affects the skin on your feet. You have been prescribed clotrimazole and betamethasone cream to apply twice daily to the affected area. If the topical treatment does not show improvement after one month, we may consider oral antifungal medication.  INSTRUCTIONS: Please  follow up with us  if you do not notice any improvement in your symptoms or if you have any concerns. Continue with the prescribed treatments and recommended exercises. If the cream for your foot infection does not work after one month, contact us  for further evaluation and possible oral medication.                      Contains text generated by [employer].                                 Contains text generated by [employer]bridge.  Plantar Fasciitis (Heel Spur Syndrome) with Rehab The plantar fascia is a fibrous, ligament-like, soft-tissue structure that spans the bottom of the foot. Plantar fasciitis is a condition that causes pain in the foot due to inflammation of the tissue. SYMPTOMS  Pain and tenderness on the underneath side of the foot. Pain that worsens with standing or walking. CAUSES  Plantar fasciitis is caused by irritation and injury to the plantar fascia on the underneath side of the foot. Common mechanisms of injury include: Direct trauma to bottom of the foot. Damage to a small nerve that runs under the foot where the main fascia attaches to the heel bone. Stress placed on the plantar fascia due to bone spurs. RISK INCREASES WITH:  Activities that place stress on the plantar fascia (running, jumping, pivoting, or cutting). Poor strength and flexibility. Improperly fitted shoes. Tight calf muscles. Flat feet. Failure to warm-up properly before activity. Obesity. PREVENTION Warm up and stretch properly before activity. Allow for adequate recovery between workouts. Maintain physical fitness: Strength, flexibility, and endurance. Cardiovascular fitness.  Maintain a health body weight. Avoid stress on the plantar fascia. Wear properly fitted shoes, including arch supports for individuals who have flat feet.  PROGNOSIS  If treated properly, then the symptoms of plantar fasciitis usually resolve without surgery. However, occasionally  surgery is necessary.  RELATED COMPLICATIONS  Recurrent symptoms that may result in a chronic condition. Problems of the lower back that are caused by compensating for the injury, such as limping. Pain or weakness of the foot during push-off following surgery. Chronic inflammation, scarring, and partial or complete fascia tear, occurring more often from repeated injections.  TREATMENT  Treatment initially involves the use of ice and medication to help reduce pain and inflammation. The use of strengthening and stretching exercises may help reduce pain with activity, especially stretches of the Achilles tendon. These exercises may be performed at home or with a therapist. Your caregiver may recommend that you use heel cups of arch supports to help reduce stress on the plantar fascia. Occasionally, corticosteroid injections are given to reduce inflammation. If symptoms persist for greater than 6 months despite non-surgical (conservative), then surgery may be recommended.   MEDICATION  If pain medication is necessary, then nonsteroidal anti-inflammatory medications, such as aspirin and ibuprofen, or other minor pain relievers, such as acetaminophen , are often recommended. Do not take pain medication within 7 days before surgery. Prescription pain relievers may be given if deemed necessary by your caregiver. Use only as directed and only as much as you need. Corticosteroid injections may be given by your caregiver. These injections should be reserved for the most serious cases, because they may only be given a certain number of times.  HEAT AND COLD Cold treatment (icing) relieves pain and reduces inflammation. Cold treatment should be applied for 10 to 15 minutes every 2 to 3 hours for inflammation and pain and immediately after any activity that aggravates your symptoms. Use ice packs or massage the area with a piece of ice (ice massage). Heat treatment may be used prior to performing the stretching  and strengthening activities prescribed by your caregiver, physical therapist, or athletic trainer. Use a heat pack or soak the injury in warm water.  SEEK IMMEDIATE MEDICAL CARE IF: Treatment seems to offer no benefit, or the condition worsens. Any medications produce adverse side effects.  EXERCISES- RANGE OF MOTION (ROM) AND STRETCHING EXERCISES - Plantar Fasciitis (Heel Spur Syndrome) These exercises may help you when beginning to rehabilitate your injury. Your symptoms may resolve with or without further involvement from your physician, physical therapist or athletic trainer. While completing these exercises, remember:  Restoring tissue flexibility helps normal motion to return to the joints. This allows healthier, less painful movement and activity. An effective stretch should be held for at least 30 seconds. A stretch should never be painful. You should only feel a gentle lengthening or release in the stretched tissue.  RANGE OF MOTION - Toe Extension, Flexion Sit with your right / left leg crossed over your opposite knee. Grasp your toes and gently pull them back toward the top of your foot. You should feel a stretch on the bottom of your toes and/or foot. Hold this stretch for 10 seconds. Now, gently pull your toes toward the bottom of your foot. You should feel a stretch on the top of your toes and or foot. Hold this stretch for 10 seconds. Repeat  times. Complete this stretch 3 times per day.   RANGE OF MOTION - Ankle Dorsiflexion, Active Assisted Remove shoes and sit on  a chair that is preferably not on a carpeted surface. Place right / left foot under knee. Extend your opposite leg for support. Keeping your heel down, slide your right / left foot back toward the chair until you feel a stretch at your ankle or calf. If you do not feel a stretch, slide your bottom forward to the edge of the chair, while still keeping your heel down. Hold this stretch for 10 seconds. Repeat 3  times. Complete this stretch 2 times per day.   STRETCH  Gastroc, Standing Place hands on wall. Extend right / left leg, keeping the front knee somewhat bent. Slightly point your toes inward on your back foot. Keeping your right / left heel on the floor and your knee straight, shift your weight toward the wall, not allowing your back to arch. You should feel a gentle stretch in the right / left calf. Hold this position for 10 seconds. Repeat 3 times. Complete this stretch 2 times per day.  STRETCH  Soleus, Standing Place hands on wall. Extend right / left leg, keeping the other knee somewhat bent. Slightly point your toes inward on your back foot. Keep your right / left heel on the floor, bend your back knee, and slightly shift your weight over the back leg so that you feel a gentle stretch deep in your back calf. Hold this position for 10 seconds. Repeat 3 times. Complete this stretch 2 times per day.  STRETCH  Gastrocsoleus, Standing  Note: This exercise can place a lot of stress on your foot and ankle. Please complete this exercise only if specifically instructed by your caregiver.  Place the ball of your right / left foot on a step, keeping your other foot firmly on the same step. Hold on to the wall or a rail for balance. Slowly lift your other foot, allowing your body weight to press your heel down over the edge of the step. You should feel a stretch in your right / left calf. Hold this position for 10 seconds. Repeat this exercise with a slight bend in your right / left knee. Repeat 3 times. Complete this stretch 2 times per day.   STRENGTHENING EXERCISES - Plantar Fasciitis (Heel Spur Syndrome)  These exercises may help you when beginning to rehabilitate your injury. They may resolve your symptoms with or without further involvement from your physician, physical therapist or athletic trainer. While completing these exercises, remember:  Muscles can gain both the endurance and  the strength needed for everyday activities through controlled exercises. Complete these exercises as instructed by your physician, physical therapist or athletic trainer. Progress the resistance and repetitions only as guided.  STRENGTH - Towel Curls Sit in a chair positioned on a non-carpeted surface. Place your foot on a towel, keeping your heel on the floor. Pull the towel toward your heel by only curling your toes. Keep your heel on the floor. Repeat 3 times. Complete this exercise 2 times per day.  STRENGTH - Ankle Inversion Secure one end of a rubber exercise band/tubing to a fixed object (table, pole). Loop the other end around your foot just before your toes. Place your fists between your knees. This will focus your strengthening at your ankle. Slowly, pull your big toe up and in, making sure the band/tubing is positioned to resist the entire motion. Hold this position for 10 seconds. Have your muscles resist the band/tubing as it slowly pulls your foot back to the starting position. Repeat 3 times. Complete this  exercises 2 times per day.  Document Released: 05/16/2005 Document Revised: 08/08/2011 Document Reviewed: 08/28/2008 [facility] Patient Information 2014 [place], [place].

## 2024-03-14 ENCOUNTER — Encounter: Payer: Self-pay | Admitting: Podiatry

## 2024-03-14 NOTE — Progress Notes (Signed)
 Subjective:  Patient ID: Sydney [name], female    DOB: [birth year],  MRN: [id number]  Chief Complaint  Patient presents with   Foot Pain    RM 1 Patient is here for bilateral heel pain. Pt states pain present for x 6 months. Works in hospital standing for 8+hours.    Discussed the use of AI scribe software for clinical note transcription with the patient, who gave verbal consent to proceed.  History of Present Illness Sydney [name] is a 53 year old female who presents with chronic heel pain.  She has been experiencing heel pain for over six months, primarily located at the base of the heel, particularly in the back, and is worse on the right side. The pain is severe upon waking, described as feeling like 'somebody just beat me down both feet when I get up.'  She has attempted to manage the pain by changing shoes and insoles, but continues to experience issues. 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 Walking barefoot on hard surfaces after moving from a carpeted home may have exacerbated the pain.  She has not taken any medications for the pain, such as ibuprofen, and is unsure of what to do next. No gastrointestinal issues or bleeding have been experienced.  She mentions having flat feet and difficulty wearing heels as she used to. She reports frequent popping and stretching of her ankles, particularly when in bed.  She has been dealing with callous and irritation on one foot for years, unsuccessfully treated with over-the-counter antifungal medications. No known skin conditions like eczema or psoriasis, although she mentions being told she has 'spots.'  No pain in the arch of the foot and no history of gastric reflux or gastrointestinal issues. No known allergies to medications.      Objective:    Physical Exam VASCULAR: DP and PT pulse palpable. Foot is warm and well-perfused.  Capillary fill time is brisk. DERMATOLOGIC: Peeling moccasin distribution rash on left foot. Normal skin turgor, texture, and temperature. No open lesions or ulcerations. NEUROLOGIC: Normal sensation to light touch and pressure. No paresthesias on examination. ORTHOPEDIC: Bilateral pes planus deformity and gastrocnemius equinus. Pain on palpation of plantar medial central heel, right and left foot. Smooth pain-free range of motion of all examined joints. No ecchymosis or bruising. No gross deformity.   No images are attached to the encounter.    Results RADIOLOGY Foot Radiographs: Moderate pes planus deformity, hindfoot valgus, small calcaneus on the right foot, no fracture, stress fracture, or major degenerative changes. (03/13/2024)   Assessment:   1. Heel pain, bilateral   2. Plantar fasciitis of left foot   3. Plantar fasciitis of right foot   4. Pes planus of both feet   5. Tinea pedis of left foot      Plan:  Patient was evaluated and treated and all questions answered.  Assessment and Plan Assessment & Plan Bilateral plantar fasciitis Chronic plantar fasciitis with pain primarily in the right heel, persisting for over six months. Pain is worse in the morning. Contributing factors include pes planus and tight calf muscles, leading to increased strain on the plantar fascia. - Administer steroid injection (cortisone) to the right heel to reduce inflammation. - Prescribe meloxicam  for oral anti-inflammatory treatment. - Recommend physical therapy exercises to be performed at home with the assistance of a personal trainer. - Advise wearing supportive shoes and rotating them regularly. -  Suggest obtaining custom insoles for additional arch support.  Bilateral pes planus with gastrocnemius equinus Bilateral pes planus with associated gastrocnemius equinus, contributing to plantar fasciitis. The flat foot structure increases strain on the plantar fascia, and tight calf muscles  exacerbate the condition. - Recommend custom insoles to provide arch support and alleviate strain on the plantar fascia. - Advise stretching exercises to address tight calf muscles.  Moccasin distribution tinea pedis, left foot Chronic moccasin distribution tinea pedis on the left foot, resistant to over-the-counter treatments. Differential diagnosis includes eczema or dermatitis, but fungal infection is suspected. - Prescribe clotrimazole and betamethasone cream to be applied twice daily to the affected area. - Consider oral antifungal medication if topical treatment is ineffective after one month.  After sterile prep with povidone-iodine  solution and alcohol, the right heel was injected with 0.5cc 2% xylocaine  plain, 0.5cc 0.5% marcaine  plain, 20 mg triamcinolone  acetonide, and 4 mg dexamethasone  was injected along the medial plantar fascia at the insertion on the plantar calcaneus. The patient tolerated the procedure well without complication.     Return in about 8 weeks (around 05/08/2024) for recheck plantar fasciitis.

## 2024-03-21 ENCOUNTER — Other Ambulatory Visit

## 2024-03-22 ENCOUNTER — Other Ambulatory Visit

## 2024-05-04 ENCOUNTER — Other Ambulatory Visit: Payer: Self-pay | Admitting: Podiatry

## 2024-05-08 ENCOUNTER — Ambulatory Visit: Admitting: Podiatry

## 2024-05-08 VITALS — Ht 62.0 in | Wt 163.0 lb

## 2024-05-08 DIAGNOSIS — M722 Plantar fascial fibromatosis: Secondary | ICD-10-CM | POA: Diagnosis not present

## 2024-05-08 DIAGNOSIS — B353 Tinea pedis: Secondary | ICD-10-CM | POA: Diagnosis not present

## 2024-05-08 NOTE — Progress Notes (Signed)
°  Subjective:  Patient ID: Sydney [name], female    DOB: [birth year],  MRN: [id number]  Chief Complaint  Patient presents with   Plantar Fasciitis    Rm 6 Patient is here to f/u on bilateral plantar fasciitis. Pt states pain in heels have subsided since receiving steroid shots, using insoles and taking meloxicam .    Discussed the use of AI scribe software for clinical note transcription with the patient, who gave verbal consent to proceed.  History of Present Illness Sydney [name] is a 53 year old female who presents with chronic heel pain.  She returns for follow-up doing much better, only minimal tenderness in the right side no pain in the left skin is doing much better if using the cream as well.      Objective:    Physical Exam VASCULAR: DP and PT pulse palpable. Foot is warm and well-perfused. Capillary fill time is brisk. DERMATOLOGIC: Tinea pedis improving and nearly fully resolved NEUROLOGIC: Normal sensation to light touch and pressure. No paresthesias on examination. ORTHOPEDIC: No pain to palpation in left heel today.  Mild tenderness at the plantar central heel   No images are attached to the encounter.    Results RADIOLOGY Foot Radiographs: Moderate pes planus deformity, hindfoot valgus, small calcaneus on the right foot, no fracture, stress fracture, or major degenerative changes. (03/13/2024)   Assessment:   1. Plantar fasciitis of right foot   2. Tinea pedis of left foot       Plan:  Patient was evaluated and treated and all questions answered.  Assessment and Plan Assessment & Plan Plantar fasciitis, pes planus, tinea pedis Overall doing much better with all issues.  She should continue use the Lotrisone  cream until it fully resolves.  Regarding her plantar fasciitis and pes planus utilizing insoles and home physical therapy has helped quite a bit and I recommend she continue to do these at least for the next 6 to 8 weeks until her  symptoms resolved.  Should not need further injection at this point but if not resolved in 2 months or worsen to the point would consider reinjection if needed.  Follow-up as needed.  Can start to reduce meloxicam  dose to 7.5 mg for few weeks and then try to go without it and supplement as needed with OTC ibuprofen for few weeks before ceasing anti-inflammatory.      No follow-ups on file.
